# Patient Record
Sex: Male | Born: 1975
Health system: Southern US, Community
[De-identification: ages and names within clinical notes are randomized; demographics above are authoritative.]

## PROBLEM LIST (undated history)

## (undated) DIAGNOSIS — I209 Angina pectoris, unspecified: Secondary | ICD-10-CM

## (undated) DIAGNOSIS — T8859XA Other complications of anesthesia, initial encounter: Secondary | ICD-10-CM

## (undated) DIAGNOSIS — E785 Hyperlipidemia, unspecified: Secondary | ICD-10-CM

## (undated) DIAGNOSIS — J189 Pneumonia, unspecified organism: Secondary | ICD-10-CM

## (undated) DIAGNOSIS — D1721 Benign lipomatous neoplasm of skin and subcutaneous tissue of right arm: Secondary | ICD-10-CM

## (undated) DIAGNOSIS — L0291 Cutaneous abscess, unspecified: Principal | ICD-10-CM

## (undated) HISTORY — DX: Cutaneous abscess, unspecified: L02.91

## (undated) HISTORY — PX: SPINE SURGERY: SHX786

## (undated) HISTORY — PX: WISDOM TOOTH EXTRACTION: SHX21

## (undated) HISTORY — PX: EYE SURGERY: SHX253

---

## 1998-05-16 HISTORY — PX: REFRACTIVE SURGERY: SHX103

## 1998-05-16 HISTORY — PX: EYE SURGERY: SHX253

## 1999-05-17 HISTORY — PX: OTHER SURGICAL HISTORY: SHX169

## 2002-05-16 HISTORY — PX: KNEE CARTILAGE SURGERY: SHX688

## 2012-05-16 HISTORY — PX: OTHER SURGICAL HISTORY: SHX169

## 2013-12-21 ENCOUNTER — Inpatient Hospital Stay
Admit: 2013-12-21 | Discharge: 2013-12-22 | Disposition: A | Discharge status: Home / Self Care | Attending: Emergency Medicine

## 2013-12-21 NOTE — ED Provider Notes (Signed)
PATIENT:          Shawn Brock, Shawn Brock  DOS:           12/21/2013  MR #:             [id number]             ACCOUNT #:     [id number]  DATE OF BIRTH:    August 14, 1975              AGE:           38      PROBLEM LIST:     Abcess on back: Entered Date: 21-Dec-2013 18:52, Entered By:  Estill Dooms,  INTERFACES    HISTORY OF PRESENT ILLNESS:    PERTINENT HISTORY OF PRESENT  ILLNESS. Patient presents today with  an  abscess.  Patient state that he has an abscess on his back for the  past 8  years.  He states that this has gradually gotten larger and while at  work  yesterday felt a "pop" in his abscess and felt purulent drainage.  Patient  denies any systemic symptoms such as fever or chills.    PERTINENT PAST/ FAMILY/SOCIAL HISTORY Patient denies      PHYSICAL EXAM Vital signs stable, afebrile.  Well-developed male in  no  distress.  Focused physical exam reveals a 5 cm fluctuant abscess in  his upper  mid back.  There is no surrounding erythema.  It does appear to be  actively  draining.    MEDICAL DECISION MAKING:    SIGNIFICANT FINDINGS/ED COURSE/MEDICAL DECISION MAKING/TREATMENT  PLAN Consent  was obtained to perform an incision and drainage.  The area was  prepped in  sterile fashion.  7 mL of lidocaine with epinephrine was used to  obtain  anesthesia.  An 11 blade was used to make a incision linearly across  the entire  abscess.  A large amount of purulence was expressed.  Hemostats were  used to  break up loculations.  The abscess was thoroughly irrigated with  normal saline.   The patient tolerated the procedure well.  He was advised to soak in  a bathtub  3 times a day for the next couple days.  Return precautions were  discussed.  He  voiced understanding.  All questions were answered.  He is  discharged.      DIAGNOSIS 5 cm back abscess status post incision and drainage by  Emergency  Department physician    COPIES SENT TO::     Genelle Gather WILLIAM(PCP): [id number]    Electronic Signatures:  Burnis Medin (MD)   (Signed 22-Dec-2013 04:18)   Authored: PROBLEM LIST, HISTORY OF PRESENT ILLNESS, PHYSICAL EXAM,  MEDICAL  DECISION MAKING, DIAGNOSIS, Copies to be sent to:      Last Updated: 22-Dec-2013 04:18 by Burnis Medin (MD)            Please see T-Sheet, initial assessment, and physician orders for  further details.    Dictating Physician: Burnis Medin, MD  Original Electronic Signature Date: 12/21/2013 07:49 P  VN  Document #: [id number]    cc:  Darel Hong, DO       [place] [address]

## 2014-05-15 ENCOUNTER — Emergency Department (HOSPITAL_COMMUNITY): Payer: Federal, State, Local not specified - PPO

## 2014-05-15 ENCOUNTER — Emergency Department (HOSPITAL_COMMUNITY)
Admission: EM | Admit: 2014-05-15 | Discharge: 2014-05-15 | Disposition: A | Discharge status: Home / Self Care | Payer: Federal, State, Local not specified - PPO | Attending: Emergency Medicine | Admitting: Emergency Medicine

## 2014-05-15 ENCOUNTER — Encounter (HOSPITAL_COMMUNITY): Payer: Self-pay | Admitting: *Deleted

## 2014-05-15 DIAGNOSIS — S8261XA Displaced fracture of lateral malleolus of right fibula, initial encounter for closed fracture: Secondary | ICD-10-CM | POA: Insufficient documentation

## 2014-05-15 DIAGNOSIS — S99911A Unspecified injury of right ankle, initial encounter: Secondary | ICD-10-CM | POA: Diagnosis present

## 2014-05-15 DIAGNOSIS — Y998 Other external cause status: Secondary | ICD-10-CM | POA: Diagnosis not present

## 2014-05-15 DIAGNOSIS — Y9389 Activity, other specified: Secondary | ICD-10-CM | POA: Insufficient documentation

## 2014-05-15 DIAGNOSIS — Z7982 Long term (current) use of aspirin: Secondary | ICD-10-CM | POA: Diagnosis not present

## 2014-05-15 DIAGNOSIS — S82401A Unspecified fracture of shaft of right fibula, initial encounter for closed fracture: Secondary | ICD-10-CM

## 2014-05-15 DIAGNOSIS — Y9289 Other specified places as the place of occurrence of the external cause: Secondary | ICD-10-CM | POA: Diagnosis not present

## 2014-05-15 DIAGNOSIS — X58XXXA Exposure to other specified factors, initial encounter: Secondary | ICD-10-CM | POA: Insufficient documentation

## 2014-05-15 DIAGNOSIS — T1490XA Injury, unspecified, initial encounter: Secondary | ICD-10-CM

## 2014-05-15 MED ORDER — IBUPROFEN 800 MG PO TABS: 800.0000 mg | ORAL_TABLET | Freq: Three times a day (TID) | ORAL | Status: DC

## 2014-05-15 NOTE — ED Notes (Signed)
Pt states he rolled his ankle while coming down the steps 2 days ago. Pt states he felt a tearing sensation in his right ankle. Pt's ankle appears swollen. Pt states his pain is 6/10.

## 2014-05-15 NOTE — Discharge Instructions (Signed)
Cryotherapy °Cryotherapy means treatment with cold. Ice or gel packs can be used to reduce both pain and swelling. Ice is the most helpful within the first 24 to 48 hours after an injury or flare-up from overusing a muscle or joint. Sprains, strains, spasms, burning pain, shooting pain, and aches can all be eased with ice. Ice can also be used when recovering from surgery. Ice is effective, has very few side effects, and is safe for most people to use. °PRECAUTIONS  °Ice is not a safe treatment option for people with: °· Raynaud phenomenon. This is a condition affecting small blood vessels in the extremities. Exposure to cold may cause your problems to return. °· Cold hypersensitivity. There are many forms of cold hypersensitivity, including: °¨ Cold urticaria. Red, itchy hives appear on the skin when the tissues begin to warm after being iced. °¨ Cold erythema. This is a red, itchy rash caused by exposure to cold. °¨ Cold hemoglobinuria. Red blood cells break down when the tissues begin to warm after being iced. The hemoglobin that carry oxygen are passed into the urine because they cannot combine with blood proteins fast enough. °· Numbness or altered sensitivity in the area being iced. °If you have any of the following conditions, do not use ice until you have discussed cryotherapy with your caregiver: °· Heart conditions, such as arrhythmia, angina, or chronic heart disease. °· High blood pressure. °· Healing wounds or open skin in the area being iced. °· Current infections. °· Rheumatoid arthritis. °· Poor circulation. °· Diabetes. °Ice slows the blood flow in the region it is applied. This is beneficial when trying to stop inflamed tissues from spreading irritating chemicals to surrounding tissues. However, if you expose your skin to cold temperatures for too long or without the proper protection, you can damage your skin or nerves. Watch for signs of skin damage due to cold. °HOME CARE INSTRUCTIONS °Follow  these tips to use ice and cold packs safely. °· Place a dry or damp towel between the ice and skin. A damp towel will cool the skin more quickly, so you may need to shorten the time that the ice is used. °· For a more rapid response, add gentle compression to the ice. °· Ice for no more than 10 to 20 minutes at a time. The bonier the area you are icing, the less time it will take to get the benefits of ice. °· Check your skin after 5 minutes to make sure there are no signs of a poor response to cold or skin damage. °· Rest 20 minutes or more between uses. °· Once your skin is numb, you can end your treatment. You can test numbness by very lightly touching your skin. The touch should be so light that you do not see the skin dimple from the pressure of your fingertip. When using ice, most people will feel these normal sensations in this order: cold, burning, aching, and numbness. °· Do not use ice on someone who cannot communicate their responses to pain, such as small children or people with dementia. °HOW TO MAKE AN ICE PACK °Ice packs are the most common way to use ice therapy. Other methods include ice massage, ice baths, and cryosprays. Muscle creams that cause a cold, tingly feeling do not offer the same benefits that ice offers and should not be used as a substitute unless recommended by your caregiver. °To make an ice pack, do one of the following: °· Place crushed ice or a   bag of frozen vegetables in a sealable plastic bag. Squeeze out the excess air. Place this bag inside another plastic bag. Slide the bag into a pillowcase or place a damp towel between your skin and the bag.  Mix 3 parts water with 1 part rubbing alcohol. Freeze the mixture in a sealable plastic bag. When you remove the mixture from the freezer, it will be slushy. Squeeze out the excess air. Place this bag inside another plastic bag. Slide the bag into a pillowcase or place a damp towel between your skin and the bag. SEEK MEDICAL CARE  IF:  You develop white spots on your skin. This may give the skin a blotchy (mottled) appearance.  Your skin turns blue or pale.  Your skin becomes waxy or hard.  Your swelling gets worse. MAKE SURE YOU:   Understand these instructions.  Will watch your condition.  Will get help right away if you are not doing well or get worse. Document Released: 12/27/2010 Document Revised: 09/16/2013 Document Reviewed: 12/27/2010 [facility] Patient Information 2015 [place], [place]. This information is not intended to replace advice given to you by your health care provider. Make sure you discuss any questions you have with your health care provider. Fibular Fracture, Ankle, Adult, Treated With or Without Immobilization A fibular fracture at your ankle is a break (fracture) bone in the smallest of the two bones in your lower leg, located on the outside of your leg (fibula) close to the area at your ankle joint. CAUSES  Rolling your ankle.  Twisting your ankle.  Extreme flexing or extending of your foot.  Severe force on your ankle as when falling from a distance. RISK FACTORS  Jumping activities.  Participation in sports.  Osteoporosis.  Advanced age.  Previous ankle injuries. SIGNS AND SYMPTOMS  Pain.  Swelling.  Inability to put weight on injured ankle.  Bruising.  Bone deformities at site of injury. DIAGNOSIS  This fracture is diagnosed with the help of an X-ray exam. TREATMENT  If the fractured bone did not move out of place it usually will heal without problems and does casting or splinting. If immobilization is needed for comfort or the fractured bone moved out of place and will not heal properly with immobilization, a cast or splint will be used. HOME CARE INSTRUCTIONS   Apply ice to the area of injury:  Put ice in a plastic bag.  Place a towel between your skin and the bag.  Leave the ice on for 20 minutes, 2-3 times a day.  Use crutches as directed. Resume  walking without crutches as directed by your health care provider.  Only take over-the-counter or prescription medicines for pain, discomfort, or fever as directed by your health care provider.  If you have a removable splint or boot, do not remove the boot unless directed by your health care provider. SEEK MEDICAL CARE IF:   You have continued pain or more swelling  The medications do not control the pain. SEEK IMMEDIATE MEDICAL CARE IF:  You develop severe pain in the leg or foot.  Your skin or nails below the injury turn blue or grey or feel cold or numb. MAKE SURE YOU:   Understand these instructions.  Will watch your condition.  Will get help right away if you are not doing well or get worse. Document Released: 05/02/2005 Document Revised: 02/20/2013 Document Reviewed: 12/12/2012 [facility] Patient Information 2015 [place], [place]. This information is not intended to replace advice given to you by your health  care provider. Make sure you discuss any questions you have with your health care provider.

## 2014-05-15 NOTE — ED Provider Notes (Signed)
CSN: [id number]     Arrival date & time 05/15/14  2212 History  This chart was scribed for non-physician practitioner, Charlann Lange, PA-C,working with Mirna Mires, MD, by Marlowe Kays, ED Scribe. This patient was seen in room WTR5/WTR5 and the patient's care was started at 10:32 PM.  Chief Complaint  Patient presents with  . Ankle Injury   The history is provided by the patient. No language interpreter was used.    HPI Comments:  Brandon Clayton is a 38 y.o. male who presents to the Emergency Department complaining of a right ankle injury that occurred yesterday. Pt states he rolled the ankle and felt a tearing sensation. He reports moderate lateral pain and swelling. He states he has been ambulatory since the incident. He has been taking Ibuprofen with no significant relief of the pain and swelling. Walking tends to make the pain worse. Denies alleviating factors. Denies numbness, tingling, nausea or vomiting.   History reviewed. No pertinent past medical history. History reviewed. No pertinent past surgical history. No family history on file. History  Substance Use Topics  . Smoking status: Never Smoker   . Smokeless tobacco: Not on file  . Alcohol Use: No    Review of Systems  Gastrointestinal: Negative for nausea and vomiting.  Musculoskeletal: Positive for joint swelling and arthralgias.  Skin: Negative for wound.  Neurological: Negative for numbness.  All other systems reviewed and are negative.   Allergies  Review of patient's allergies indicates no known allergies.  Home Medications   Prior to Admission medications   Medication Sig Start Date End Date Taking? Authorizing Provider  acetaminophen (TYLENOL) 500 MG tablet Take 1,000 mg by mouth every 6 (six) hours as needed for mild pain.   Yes Historical Provider, MD  aspirin 325 MG tablet Take 325 mg by mouth every 4 (four) hours as needed for mild pain.   Yes Historical Provider, MD   Triage Vitals: BP 106/63  mmHg  Pulse 78  Temp(Src) 97.4 F (36.3 C) (Oral)  SpO2 97% Physical Exam  Constitutional: He is oriented to person, place, and time. He appears well-developed and well-nourished.  HENT:  Head: Normocephalic and atraumatic.  Eyes: EOM are normal.  Neck: Normal range of motion.  Cardiovascular: Normal rate.   Pulmonary/Chest: Effort normal.  Musculoskeletal: Normal range of motion. He exhibits tenderness.  Right ankle markedly swollen, greater laterally than medially. Moderate ecchymosis. Stable joint. Swelling extends to lateral forefoot without tenderness. No calf or shin tenderness.  Neurological: He is alert and oriented to person, place, and time.  Distal sensations intact.  Skin: Skin is warm and dry.  Psychiatric: He has a normal mood and affect. His behavior is normal.  Nursing note and vitals reviewed.   ED Course  Procedures (including critical care time) DIAGNOSTIC STUDIES: Oxygen Saturation is 97% on RA, normal by my interpretation.   COORDINATION OF CARE: 10:36 PM- Will X-Ray right foot and ankle. Offered pain medication but pt declined. Pt verbalizes understanding and agrees to plan.  Medications - No data to display  Labs Review Labs Reviewed - No data to display  Imaging Review Dg Ankle Complete Right  05/15/2014   CLINICAL DATA:  [place] down garage steps 2 days ago, lateral ankle pain and swelling.  EXAM: RIGHT ANKLE - COMPLETE 3+ VIEW; RIGHT FOOT COMPLETE - 3+ VIEW  COMPARISON:  None.  FINDINGS: Lateral malleolus comminuted avulsion fracture. Fractures in alignment. Ankle mortise is congruent, tibial fibular syndesmosis appears intact. No destructive bony  lesions. Lateral ankle soft tissue swelling without subcutaneous gas or radiopaque foreign bodies.  IMPRESSION: Comminuted lateral malleolus acute avulsion fracture without dislocation.   Electronically Signed   By: Elon Alas   On: 05/15/2014 23:32   Dg Foot Complete Right  05/15/2014   CLINICAL DATA:   [place] down garage steps 2 days ago, lateral ankle pain and swelling.  EXAM: RIGHT ANKLE - COMPLETE 3+ VIEW; RIGHT FOOT COMPLETE - 3+ VIEW  COMPARISON:  None.  FINDINGS: Lateral malleolus comminuted avulsion fracture. Fractures in alignment. Ankle mortise is congruent, tibial fibular syndesmosis appears intact. No destructive bony lesions. Lateral ankle soft tissue swelling without subcutaneous gas or radiopaque foreign bodies.  IMPRESSION: Comminuted lateral malleolus acute avulsion fracture without dislocation.   Electronically Signed   By: Elon Alas   On: 05/15/2014 23:32     EKG Interpretation None      MDM   Final diagnoses:  Injury    1. Right fibula fracture  CAM walker boot provided. Patient has crutches. Discussed weight bearing ok as tolerated, orthopedic follow up to insure appropriate healing. Patient declines pain medication.   I personally performed the services described in this documentation, which was scribed in my presence. The recorded information has been reviewed and is accurate.    Dewaine Oats, PA-C 05/15/14 [place], MD 05/15/14 [phone]

## 2015-12-03 ENCOUNTER — Ambulatory Visit (HOSPITAL_COMMUNITY)
Admission: RE | Admit: 2015-12-03 | Discharge: 2015-12-03 | Disposition: A | Discharge status: Home / Self Care | Payer: Federal, State, Local not specified - PPO | Source: Ambulatory Visit | Attending: Physician Assistant | Admitting: Physician Assistant

## 2015-12-03 ENCOUNTER — Ambulatory Visit (INDEPENDENT_AMBULATORY_CARE_PROVIDER_SITE_OTHER): Payer: Federal, State, Local not specified - PPO | Admitting: Physician Assistant

## 2015-12-03 VITALS — BP 122/70 | HR 84 | Temp 98.5°F | Resp 18 | Ht 68.0 in | Wt 172.0 lb

## 2015-12-03 DIAGNOSIS — S060X0A Concussion without loss of consciousness, initial encounter: Secondary | ICD-10-CM

## 2015-12-03 DIAGNOSIS — R112 Nausea with vomiting, unspecified: Secondary | ICD-10-CM | POA: Diagnosis present
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
## 2015-12-03 NOTE — Patient Instructions (Addendum)
Please report to [facility] and go to the first floor registration for your CT scan. They are expecting you now.     IF you received an x-ray today, you will receive an invoice from [facility]. Please contact [facility] at [phone] with questions or concerns regarding your invoice.   IF you received labwork today, you will receive an invoice from [facility]. Please contact [facility] at [phone] with questions or concerns regarding your invoice.   Our billing staff will not be able to assist you with questions regarding bills from these companies.  You will be contacted with the lab results as soon as they are available. The fastest way to get your results is to activate your My Chart account. Instructions are located on the last page of this paperwork. If you have not heard from [place] regarding the results in 2 weeks, please contact this office.

## 2016-05-11 ENCOUNTER — Ambulatory Visit (INDEPENDENT_AMBULATORY_CARE_PROVIDER_SITE_OTHER): Payer: Federal, State, Local not specified - PPO | Admitting: Family Medicine

## 2016-05-11 VITALS — BP 122/76 | HR 65 | Temp 97.7°F | Resp 17 | Wt 183.0 lb

## 2016-05-11 DIAGNOSIS — Z0189 Encounter for other specified special examinations: Secondary | ICD-10-CM | POA: Diagnosis not present

## 2016-05-11 NOTE — Patient Instructions (Signed)
     IF you received an x-ray today, you will receive an invoice from [facility]. Please contact [facility] at [phone] with questions or concerns regarding your invoice.   IF you received labwork today, you will receive an invoice from [facility]. Please contact [facility] at [phone] with questions or concerns regarding your invoice.   Our billing staff will not be able to assist you with questions regarding bills from these companies.  You will be contacted with the lab results as soon as they are available. The fastest way to get your results is to activate your My Chart account. Instructions are located on the last page of this paperwork. If you have not heard from us regarding the results in 2 weeks, please contact this office.     

## 2016-05-11 NOTE — Progress Notes (Signed)
Lab Visit Only   Patient brought with him a written order from Dr.Peter R. Martha Clan, MD with [facility].  The order request the following labs: Hemachromatosis Genetic Assay, Ferritin, Iron, TIBC.  He requests results to be faxed to [phone].  Carroll Sage. Kenton Kingfisher, MSN, FNP-C [facility]

## 2016-05-18 LAB — IRON AND TIBC
Iron Saturation: 46 % (ref 15–55)
Iron: 115 ug/dL (ref 38–169)
Total Iron Binding Capacity: 249 ug/dL — ABNORMAL LOW (ref 250–450)
UIBC: 134 ug/dL (ref 111–343)

## 2016-05-18 LAB — FERRITIN: FERRITIN: 336 ng/mL (ref 30–400)

## 2016-05-18 LAB — HEMOCHROMATOSIS DNA-PCR(C282Y,H63D)

## 2016-05-26 DIAGNOSIS — Z6826 Body mass index (BMI) 26.0-26.9, adult: Secondary | ICD-10-CM | POA: Diagnosis not present

## 2016-05-26 DIAGNOSIS — J0100 Acute maxillary sinusitis, unspecified: Secondary | ICD-10-CM | POA: Diagnosis not present

## 2016-07-26 ENCOUNTER — Encounter: Payer: Self-pay | Admitting: Emergency Medicine

## 2016-07-26 ENCOUNTER — Emergency Department
Admission: EM | Admit: 2016-07-26 | Discharge: 2016-07-26 | Disposition: A | Discharge status: Home / Self Care | Payer: Federal, State, Local not specified - PPO | Attending: Emergency Medicine | Admitting: Emergency Medicine

## 2016-07-26 ENCOUNTER — Emergency Department: Payer: Federal, State, Local not specified - PPO

## 2016-07-26 DIAGNOSIS — R600 Localized edema: Secondary | ICD-10-CM | POA: Diagnosis not present

## 2016-07-26 DIAGNOSIS — M7989 Other specified soft tissue disorders: Secondary | ICD-10-CM | POA: Diagnosis not present

## 2016-07-26 DIAGNOSIS — L03113 Cellulitis of right upper limb: Secondary | ICD-10-CM

## 2016-07-26 LAB — BASIC METABOLIC PANEL
Anion gap: 5 (ref 5–15)
BUN: 12 mg/dL (ref 6–20)
CO2: 25 mmol/L (ref 22–32)
Calcium: 9.1 mg/dL (ref 8.9–10.3)
Chloride: 106 mmol/L (ref 101–111)
Creatinine, Ser: 0.9 mg/dL (ref 0.61–1.24)
GFR calc Af Amer: >60 mL/min (ref >60)
GFR calc non Af Amer: >60 mL/min (ref >60)
Glucose, Bld: 106 mg/dL — ABNORMAL HIGH (ref 65–99)
Potassium: 3.8 mmol/L (ref 3.5–5.1)
Sodium: 136 mmol/L (ref 135–145)

## 2016-07-26 LAB — CBC WITH DIFFERENTIAL/PLATELET
Basophils Absolute: 0 10*3/uL (ref 0–0.1)
Basophils Relative: 0 %
Eosinophils Absolute: 0.1 10*3/uL (ref 0–0.7)
Eosinophils Relative: 1 %
HCT: 43 % (ref 40.0–52.0)
Hemoglobin: 14.8 g/dL (ref 13.0–18.0)
Lymphocytes Relative: 12 %
Lymphs Abs: 1.7 10*3/uL (ref 1.0–3.6)
MCH: 31.8 pg (ref 26.0–34.0)
MCHC: 34.5 g/dL (ref 32.0–36.0)
MCV: 92.2 fL (ref 80.0–100.0)
Monocytes Absolute: 1.1 10*3/uL — ABNORMAL HIGH (ref 0.2–1.0)
Monocytes Relative: 8 %
Neutro Abs: 10.7 10*3/uL — ABNORMAL HIGH (ref 1.4–6.5)
Neutrophils Relative %: 79 %
Platelets: 277 10*3/uL (ref 150–440)
RBC: 4.67 MIL/uL (ref 4.40–5.90)
RDW: 12.8 % (ref 11.5–14.5)
WBC: 13.6 10*3/uL — ABNORMAL HIGH (ref 3.8–10.6)

## 2016-07-26 MED ORDER — CLINDAMYCIN HCL 150 MG PO CAPS
300.0000 mg | ORAL_CAPSULE | Freq: Once | ORAL | Status: AC
  Administered 2016-07-26: 300 mg via ORAL
  Filled 2016-07-26: qty 2

## 2016-07-26 MED ORDER — CLINDAMYCIN HCL 300 MG PO CAPS: 300.0000 mg | ORAL_CAPSULE | Freq: Three times a day (TID) | ORAL | 0 refills | Status: DC

## 2016-07-26 NOTE — ED Triage Notes (Signed)
Pt c/o right shoulder pain and right arm pain. Pain worse with movement especially extension of right hand. 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 Pain x 1 week. Pain relieved when arm over head.  Reports pain worse if presses on a nerve in his arm. Ambulatory to triage without difficulty.

## 2016-07-26 NOTE — ED Provider Notes (Signed)
[facility] Emergency Department Provider Note  ____________________________________________  Time seen: Approximately 7:51 PM  I have reviewed the triage vital signs and the nursing notes.   HISTORY  Chief Complaint Shoulder Pain    HPI Brandon Clayton is a 41 y.o. male presenting to the emergency department with 10 out of 10 right forearm pain, erythema and edema. Patient states that right forearm started feeling "hot" yesterday. He denies fever or chills. He denies streaking of the skin overlying the right upper extremity. Patient states that right upper extremity pain feels throbbing and is relieved with positioning the right upper extremity above the head. Patient denies falls or mechanism of trauma. Patient states that his pain has been constant throughout the day. Patient takes no medications daily. Patient denies prolonged immobilization, recent surgery, history of obesity, or daily smoking. No alleviating measures and attempted.   History reviewed. No pertinent past medical history.  There are no active problems to display for this patient.   History reviewed. No pertinent surgical history.  Prior to Admission medications   Not on File    Allergies Patient has no known allergies.  Family History  Problem Relation Age of Onset  . Diabetes Mother   . Cancer Mother   . Heart disease Mother   . Hyperlipidemia Mother   . Hypertension Mother   . Hyperlipidemia Father     Social History Social History  Substance Use Topics  . Smoking status: Never Smoker  . Smokeless tobacco: Never Used  . Alcohol use No     Review of Systems  Constitutional: No fever/chills Eyes: No visual changes. No discharge ENT: No upper respiratory complaints. Cardiovascular: no chest pain. Respiratory: no cough. No SOB. Gastrointestinal: No abdominal pain.  No nausea, no vomiting.  No diarrhea.  No constipation. Musculoskeletal: Patient has right  upper extremity pain. Skin: Patient has erythema and edema of the skin overlying the right forearm. Neurological: Negative for headaches, focal weakness or numbness. ____________________________________________   PHYSICAL EXAM:  VITAL SIGNS: ED Triage Vitals  Enc Vitals Group     BP 07/26/16 1828 114/68     Pulse Rate 07/26/16 1828 (!) 105     Resp 07/26/16 1828 18     Temp 07/26/16 1827 99.6 F (37.6 C)     Temp Source 07/26/16 1827 Oral     SpO2 07/26/16 1828 98 %     Weight 07/26/16 1827 180 lb (81.6 kg)     Height 07/26/16 1827 5\' 9"  (1.753 m)     Head Circumference --      Peak Flow --      Pain Score 07/26/16 1827 10     Pain Loc --      Pain Edu? --      Excl. in [place]? --      Constitutional: Alert and oriented. Patient is talkative and engaged.  Eyes: Palpebral and bulbar conjunctiva are nonerythematous bilaterally. PERRL. EOMI. No scleral icterus bilaterally. Head: Atraumatic. ENT: Hematological/Lymphatic/Immunilogical: No cervical lymphadenopathy.  Cardiovascular: Normal rate, regular rhythm. Normal S1 and S2. No murmurs, gallops or rubs auscultated.  Respiratory: Resonant and symmetric percussion tones bilaterally. On auscultation, adventitious sounds are absent.  Musculoskeletal: Patient has 5/5 strength in the upper and lower extremities bilaterally. Full range of motion at the shoulder, elbow and wrist bilaterally. Full range of motion at the hip, knee and ankle bilaterally. No changes in gait. Palpable radial and ulnar pulses bilaterally and symmetrically.  Neurologic:  Normal for  age. No gross focal neurologic deficits are appreciated. Reflexes are 2+ and symmetric in the upper extremity bilaterally. Skin: Patient has blanching erythema of the skin overlying the volar aspect of the right forearm. Skin overlying right forearm is excessively warm to palpation. No palpable fluctuance or induration. Psychiatric: Mood and affect are normal for age. Speech and behavior  are normal.  ____________________________________________   LABS (all labs ordered are listed, but only abnormal results are displayed)  Labs Reviewed  CBC WITH DIFFERENTIAL/PLATELET   ____________________________________________  EKG   ____________________________________________  RADIOLOGY   No results found.  ____________________________________________    PROCEDURES  Procedure(s) performed:    Procedures    Medications - No data to display   ____________________________________________   INITIAL IMPRESSION / ASSESSMENT AND PLAN / ED COURSE  Pertinent labs & imaging results that were available during my care of the patient were reviewed by me and considered in my medical decision making (see chart for details).  Review of the [place] CSRS was performed in accordance of the [place] prior to dispensing any controlled drugs.     Assessment and plan: Cellulitis: Patient presents to the emergency department with erythema, edema and right upper extremity pain. Venous ultrasound of the right upper extremity was noncontributory for thromboembolism. Venous ultrasound of the right upper extremity revealed a complex fluid accumulation suspicious for abscess formation. Incision and drainage cannot be performed at this time based on physical exam findings. Patient was started on clindamycin in the emergency department. Patient was discharged with clindamycin. Parameters of cellulitis were demarcated. Strict return precautions were given to patient twice. Vital signs are reassuring at this time aside from mild tachycardia. All patient questions were answered.   ____________________________________________  FINAL CLINICAL IMPRESSION(S) / ED DIAGNOSES  Final diagnoses:  Arm swelling      NEW MEDICATIONS STARTED DURING THIS VISIT:  New Prescriptions   No medications on file        This chart was dictated using voice recognition software/Dragon. Despite best efforts  to proofread, errors can occur which can change the meaning. Any change was purely unintentional.    Lannie Fields, PA-C 07/26/16 2159    Orbie Pyo, MD 07/26/16 [phone]

## 2016-07-26 NOTE — ED Notes (Signed)

## 2016-07-26 NOTE — ED Notes (Signed)
See triage note  States he developed pain to right shoulder which moves into neck about 1 week ago  W/o injury  Now states pain is moving into forearm   Increased pain with movement of fingers  And palpation

## 2016-07-28 ENCOUNTER — Observation Stay: Payer: Federal, State, Local not specified - PPO | Admitting: Anesthesiology

## 2016-07-28 ENCOUNTER — Observation Stay
Admission: EM | Admit: 2016-07-28 | Discharge: 2016-07-29 | Disposition: A | Discharge status: Home / Self Care | Payer: Federal, State, Local not specified - PPO | Attending: General Surgery | Admitting: General Surgery

## 2016-07-28 ENCOUNTER — Encounter
Admission: EM | Disposition: A | Discharge status: Home / Self Care | Payer: Self-pay | Source: Home / Self Care | Attending: Emergency Medicine

## 2016-07-28 ENCOUNTER — Encounter: Payer: Self-pay | Admitting: Emergency Medicine

## 2016-07-28 DIAGNOSIS — S50812A Abrasion of left forearm, initial encounter: Secondary | ICD-10-CM | POA: Insufficient documentation

## 2016-07-28 DIAGNOSIS — Y939 Activity, unspecified: Secondary | ICD-10-CM | POA: Diagnosis not present

## 2016-07-28 DIAGNOSIS — Z79899 Other long term (current) drug therapy: Secondary | ICD-10-CM | POA: Diagnosis not present

## 2016-07-28 DIAGNOSIS — W5503XA Scratched by cat, initial encounter: Secondary | ICD-10-CM | POA: Insufficient documentation

## 2016-07-28 DIAGNOSIS — L0291 Cutaneous abscess, unspecified: Secondary | ICD-10-CM

## 2016-07-28 DIAGNOSIS — Z8249 Family history of ischemic heart disease and other diseases of the circulatory system: Secondary | ICD-10-CM | POA: Diagnosis not present

## 2016-07-28 DIAGNOSIS — Z809 Family history of malignant neoplasm, unspecified: Secondary | ICD-10-CM | POA: Diagnosis not present

## 2016-07-28 DIAGNOSIS — L02413 Cutaneous abscess of right upper limb: Principal | ICD-10-CM | POA: Diagnosis present

## 2016-07-28 DIAGNOSIS — F909 Attention-deficit hyperactivity disorder, unspecified type: Secondary | ICD-10-CM | POA: Insufficient documentation

## 2016-07-28 DIAGNOSIS — B9562 Methicillin resistant Staphylococcus aureus infection as the cause of diseases classified elsewhere: Secondary | ICD-10-CM | POA: Diagnosis not present

## 2016-07-28 DIAGNOSIS — Z833 Family history of diabetes mellitus: Secondary | ICD-10-CM | POA: Insufficient documentation

## 2016-07-28 DIAGNOSIS — S50811A Abrasion of right forearm, initial encounter: Secondary | ICD-10-CM | POA: Insufficient documentation

## 2016-07-28 HISTORY — PX: INCISION AND DRAINAGE ABSCESS: SHX5864

## 2016-07-28 LAB — CBC WITH DIFFERENTIAL/PLATELET
BASOS ABS: 0 10*3/uL (ref 0–0.1)
Basophils Relative: 0 %
EOS PCT: 2 %
Eosinophils Absolute: 0.2 10*3/uL (ref 0–0.7)
HCT: 45.5 % (ref 40.0–52.0)
Hemoglobin: 15.7 g/dL (ref 13.0–18.0)
LYMPHS PCT: 22 %
Lymphs Abs: 2.3 10*3/uL (ref 1.0–3.6)
MCH: 31.9 pg (ref 26.0–34.0)
MCHC: 34.6 g/dL (ref 32.0–36.0)
MCV: 92.2 fL (ref 80.0–100.0)
MONOS PCT: 9 %
Monocytes Absolute: 0.9 10*3/uL (ref 0.2–1.0)
Neutro Abs: 6.8 10*3/uL — ABNORMAL HIGH (ref 1.4–6.5)
Neutrophils Relative %: 67 %
PLATELETS: 321 10*3/uL (ref 150–440)
RBC: 4.93 MIL/uL (ref 4.40–5.90)
RDW: 12.9 % (ref 11.5–14.5)
WBC: 10.2 10*3/uL (ref 3.8–10.6)

## 2016-07-28 LAB — COMPREHENSIVE METABOLIC PANEL
ALBUMIN: 4.1 g/dL (ref 3.5–5.0)
ALT: 78 U/L — ABNORMAL HIGH (ref 17–63)
AST: 72 U/L — AB (ref 15–41)
Alkaline Phosphatase: 101 U/L (ref 38–126)
Anion gap: 7 (ref 5–15)
BILIRUBIN TOTAL: 0.8 mg/dL (ref 0.3–1.2)
BUN: 13 mg/dL (ref 6–20)
CO2: 28 mmol/L (ref 22–32)
Calcium: 9.6 mg/dL (ref 8.9–10.3)
Chloride: 104 mmol/L (ref 101–111)
Creatinine, Ser: 0.96 mg/dL (ref 0.61–1.24)
GFR calc Af Amer: >60 mL/min (ref >60)
GFR calc non Af Amer: >60 mL/min (ref >60)
Glucose, Bld: 104 mg/dL — ABNORMAL HIGH (ref 65–99)
POTASSIUM: 4 mmol/L (ref 3.5–5.1)
Sodium: 139 mmol/L (ref 135–145)
TOTAL PROTEIN: 7.8 g/dL (ref 6.5–8.1)

## 2016-07-28 LAB — LACTIC ACID, PLASMA: Lactic Acid, Venous: 1.2 mmol/L (ref 0.5–1.9)

## 2016-07-28 SURGERY — INCISION AND DRAINAGE, ABSCESS
Anesthesia: General | Site: Arm Lower | Laterality: Right | Wound class: Dirty or Infected

## 2016-07-28 MED ORDER — MORPHINE SULFATE (PF) 2 MG/ML IV SOLN
2.0000 mg | INTRAVENOUS | Status: DC | PRN
  Administered 2016-07-28: 2 mg via INTRAVENOUS
  Filled 2016-07-28 (×2): qty 1

## 2016-07-28 MED ORDER — DIPHENHYDRAMINE HCL 50 MG/ML IJ SOLN: 25.0000 mg | Freq: Four times a day (QID) | INTRAMUSCULAR | Status: DC | PRN

## 2016-07-28 MED ORDER — MIDAZOLAM HCL 2 MG/2ML IJ SOLN
INTRAMUSCULAR | Status: AC
  Filled 2016-07-28: qty 2

## 2016-07-28 MED ORDER — FENTANYL CITRATE (PF) 100 MCG/2ML IJ SOLN
INTRAMUSCULAR | Status: AC
  Filled 2016-07-28: qty 2

## 2016-07-28 MED ORDER — ONDANSETRON HCL 4 MG/2ML IJ SOLN: 4.0000 mg | Freq: Four times a day (QID) | INTRAMUSCULAR | Status: DC | PRN

## 2016-07-28 MED ORDER — ONDANSETRON 4 MG PO TBDP: 4.0000 mg | ORAL_TABLET | Freq: Four times a day (QID) | ORAL | Status: DC | PRN

## 2016-07-28 MED ORDER — DIPHENHYDRAMINE HCL 25 MG PO CAPS: 25.0000 mg | ORAL_CAPSULE | Freq: Four times a day (QID) | ORAL | Status: DC | PRN

## 2016-07-28 MED ORDER — PROPOFOL 10 MG/ML IV BOLUS
INTRAVENOUS | Status: AC
  Filled 2016-07-28: qty 20

## 2016-07-28 MED ORDER — LIDOCAINE HCL (PF) 2 % IJ SOLN
INTRAMUSCULAR | Status: AC
  Filled 2016-07-28: qty 2

## 2016-07-28 SURGICAL SUPPLY — 20 items
CANISTER SUCT 1200ML W/VALVE (MISCELLANEOUS) ×2 IMPLANT
CHLORAPREP W/TINT 26ML (MISCELLANEOUS) ×2 IMPLANT
COVER PROBE FLX POLY STRL (MISCELLANEOUS) ×2 IMPLANT
DRAPE LAPAROTOMY 100X77 ABD (DRAPES) ×2 IMPLANT
ELECT CAUTERY BLADE 6.4 (BLADE) ×2 IMPLANT
ELECT REM PT RETURN 9FT ADLT (ELECTROSURGICAL) ×2
ELECTRODE REM PT RTRN 9FT ADLT (ELECTROSURGICAL) ×1 IMPLANT
GAUZE PACKING 1/4X5YD (GAUZE/BANDAGES/DRESSINGS) ×2 IMPLANT
GLOVE BIO SURGEON STRL SZ7.5 (GLOVE) ×4 IMPLANT
GLOVE INDICATOR 8.0 STRL GRN (GLOVE) ×4 IMPLANT
GOWN STRL REUS W/ TWL LRG LVL3 (GOWN DISPOSABLE) ×2 IMPLANT
GOWN STRL REUS W/TWL LRG LVL3 (GOWN DISPOSABLE) ×2
KIT RM TURNOVER STRD PROC AR (KITS) ×2 IMPLANT
LABEL OR SOLS (LABEL) ×2 IMPLANT
NEEDLE HYPO 25X1 1.5 SAFETY (NEEDLE) ×2 IMPLANT
NS IRRIG 500ML POUR BTL (IV SOLUTION) ×4 IMPLANT
PACK BASIN MINOR ARMC (MISCELLANEOUS) ×2 IMPLANT
PAD ABD DERMACEA PRESS 5X9 (GAUZE/BANDAGES/DRESSINGS) ×4 IMPLANT
SYR BULB EAR ULCER 3OZ GRN STR (SYRINGE) ×2 IMPLANT
SYRINGE 10CC LL (SYRINGE) ×2 IMPLANT

## 2016-07-28 NOTE — H&P (Signed)
Patient ID: Brandon Clayton, male   DOB: 07/26/1975, 41 y.o.   MRN: [id number]  CC: Right Arm Infection  HPI Brandon Clayton is a 41 y.o. male presents to the ER with a right arm infection. Patient reports he was seen in the ER 2 days ago and found to have a right forearm abscess. He was provided with antibiotics and did not undergo an incision and drainage at that time. Since being seen in the ER the area of swelling, pain, cellulitis has increased in size and intensity. He has been unable to work due to increasing pain and comes back to the ER very frustrated that things are Worse. He denies any fevers, chills, nausea, vomiting, chest pain, shortness breath, diarrhea, constipation. His only complaint is of pain to his right forearm. He denies any IV drug use or any recent trauma or change in health. He does however have cats and has scratch marks on bilateral forearms. He is otherwise in his usual state of health.  HPI  History reviewed. No pertinent past medical history. Patient currently on no chronic medications, however he is being treated with clindamycin on outpatient basis.  History reviewed. No pertinent surgical history. Patient has had right hand surgery before but otherwise no operations.  Family History  Problem Relation Age of Onset  . Diabetes Mother   . Cancer Mother   . Heart disease Mother   . Hyperlipidemia Mother   . Hypertension Mother   . Hyperlipidemia Father     Social History Social History  Substance Use Topics  . Smoking status: Never Smoker  . Smokeless tobacco: Never Used  . Alcohol use No    No Known Allergies  No current facility-administered medications for this encounter.    Current Outpatient Prescriptions  Medication Sig Dispense Refill  . clindamycin (CLEOCIN) 300 MG capsule Take 1 capsule (300 mg total) by mouth 3 (three) times daily. 30 capsule 0     Review of Systems A Multi-point review of systems was asked and  was negative except for the findings documented in the history of present illness  Physical Exam Blood pressure (!) 129/91, pulse 97, temperature 98.1 F (36.7 C), temperature source Oral, resp. rate 18, height 5\' 9"  (1.753 m), weight 81.6 kg (180 lb), SpO2 100 %. CONSTITUTIONAL: No acute distress. EYES: Pupils are equal, round, and reactive to light, Sclera are non-icteric. EARS, NOSE, MOUTH AND THROAT: The oropharynx is clear. The oral mucosa is pink and moist. Hearing is intact to voice. LYMPH NODES:  Lymph nodes in the neck are normal. RESPIRATORY:  Lungs are clear. There is normal respiratory effort, with equal breath sounds bilaterally, and without pathologic use of accessory muscles. CARDIOVASCULAR: Heart is regular without murmurs, gallops, or rubs. GI: The abdomen is soft, nontender, and nondistended. There are no palpable masses. There is no hepatosplenomegaly. There are normal bowel sounds in all quadrants. GU: Rectal deferred.   MUSCULOSKELETAL: Normal muscle strength and tone. No cyanosis or edema.   SKIN: Turgor is good. The right forearm is noticeably more swollen than the left. There is mild blanching erythema over the entire palmar aspect of the forearm. There are some scratch marks distal to the area in question. There is a healing wound on the opposite side of the hand. NEUROLOGIC: Motor and sensation is grossly normal. Cranial nerves are grossly intact. PSYCH:  Oriented to person, place and time. Affect is normal.  Data Reviewed Images and labs reviewed. Patient has a normal  white blood cell count 10.2, his only abnormal labs are mild increases in transaminases with an ALT of 78, AST is 72. Everything else is within normal limits. He had a mild leukocytosis 2 days ago with a white count 13.6. He had an ultrasound performed 2 days ago which showed a 1.5 x 1.5 x 2.2 cm complex fluid collection consistent with an abscess. Bedside ultrasound today shows the area appears to have  increased in size I have personally reviewed the patient's imaging, laboratory findings and medical records.    Assessment    Right arm abscess    Plan    41 year old male with an deep abscess of the right arm. Given the lack of any other traumas is mostly related to cat scratches. Provided him with numerous treatment options including attempting to access the abscess in the ER versus going to the operating room. Patient states he would rather go the operating room. Plan removal patient in the hospital under observation, once he is 8 hours from his last meal he'll be taken to the operating room for some sedation and then incision, drainage, culture of the right forearm abscess. The risks, benefits, alternatives of the incision and drainage were discussed and he voiced understanding. He will go to the operating room tonight for the incision and drainage.     Time spent with the patient was 45 minutes, with more than 50% of the time spent in face-to-face education, counseling and care coordination.     Clayburn Pert, MD FACS General Surgeon 07/28/2016, 9:19 PM

## 2016-07-28 NOTE — ED Provider Notes (Signed)
[facility] Emergency Department Provider Note  ____________________________________________   First MD Initiated Contact with Patient 07/28/16 2005     (approximate)  I have reviewed the triage vital signs and the nursing notes.   HISTORY  Chief Complaint Cellulitis    HPI Rueben Kassim III is a 41 y.o. male without any chronic medical conditions was presenting to the emergency department with worsening cellulitis. He was seen in this emergency department on March 13, 2 days ago, for right upper extremity cellulitis. At that time as well he had an ultrasound done of his right upper extremity to rule out a deep vein thrombosis. There was no deep vein thrombosis found but there was a fluid collection suspicious for abscess identified. The patient was discharged home on clindamycin and has had worsening cellulitis and pain despite being compliant with his medications. This past Tuesday as line was drawn with pending on his skin outlining the cellulitis. Since then the cellulitis has grown outside the boundaries of the line and there has also been increased swelling of the right forearm with increased pain.  Denies IVDU.     History reviewed. No pertinent past medical history.  There are no active problems to display for this patient.   History reviewed. No pertinent surgical history.  Prior to Admission medications   Medication Sig Start Date End Date Taking? Authorizing Provider  clindamycin (CLEOCIN) 300 MG capsule Take 1 capsule (300 mg total) by mouth 3 (three) times daily. 07/26/16 08/05/16  Lannie Fields, PA-C    Allergies Patient has no known allergies.  Family History  Problem Relation Age of Onset  . Diabetes Mother   . Cancer Mother   . Heart disease Mother   . Hyperlipidemia Mother   . Hypertension Mother   . Hyperlipidemia Father     Social History Social History  Substance Use Topics  . Smoking status: Never Smoker  .  Smokeless tobacco: Never Used  . Alcohol use No    Review of Systems Constitutional: No fever/chills Eyes: No visual changes. ENT: No sore throat. Cardiovascular: Denies chest pain. Respiratory: Denies shortness of breath. Gastrointestinal: No abdominal pain.  No nausea, no vomiting.   Genitourinary: Negative for dysuria. Musculoskeletal: Negative for back pain. Skin: as above Neurological: Negative for headaches, focal weakness or numbness.  10-point ROS otherwise negative.  ____________________________________________   PHYSICAL EXAM:  VITAL SIGNS: ED Triage Vitals  Enc Vitals Group     BP 07/28/16 1837 (!) 130/96     Pulse Rate 07/28/16 1837 (!) 103     Resp 07/28/16 1837 18     Temp 07/28/16 1837 98.1 F (36.7 C)     Temp Source 07/28/16 1837 Oral     SpO2 07/28/16 1837 97 %     Weight 07/28/16 1837 180 lb (81.6 kg)     Height 07/28/16 1837 5\' 9"  (1.753 m)     Head Circumference --      Peak Flow --      Pain Score 07/28/16 1838 9     Pain Loc --      Pain Edu? --      Excl. in [place]? --     Constitutional: Alert and oriented. Well appearing and in no acute distress. Eyes: Conjunctivae are normal. PERRL. EOMI. Head: Atraumatic. Nose: No congestion/rhinnorhea. Mouth/Throat: Mucous membranes are moist.   Neck: No stridor.   Cardiovascular: Normal rate, regular rhythm. Grossly normal heart sounds.   Respiratory: Normal respiratory effort.  No retractions. Lungs CTAB. Gastrointestinal: Soft and nontender. No distention. No abdominal bruits. No CVA tenderness. Musculoskeletal: No lower extremity tenderness nor edema.  No joint effusions. Neurologic:  Normal speech and language. No gross focal neurologic deficits are appreciated.  Skin:  Right forearm with erythema extending on the volar surface and wrapping around medially but not circumferentially. There is tenderness over the midshaft of the forearm but no fluctuance or pus drainage. No tract marks  visualized. Psychiatric: Mood and affect are normal. Speech and behavior are normal.  ____________________________________________   LABS (all labs ordered are listed, but only abnormal results are displayed)  Labs Reviewed  COMPREHENSIVE METABOLIC PANEL - Abnormal; Notable for the following:       Result Value   Glucose, Bld 104 (*)    AST 72 (*)    ALT 78 (*)    All other components within normal limits  CBC WITH DIFFERENTIAL/PLATELET - Abnormal; Notable for the following:    Neutro Abs 6.8 (*)    All other components within normal limits  LACTIC ACID, PLASMA   ____________________________________________  EKG   ____________________________________________  RADIOLOGY  Bedside ultrasound performed with 3 visualization of the pocket that is about 2 cm deep to the skin. There is tenderness to palpation when pressure is applied directly over this pocket with the ultrasound probe. ____________________________________________   PROCEDURES  Procedure(s) performed:   Procedures  Critical Care performed:   ____________________________________________   INITIAL IMPRESSION / ASSESSMENT AND PLAN / ED COURSE  Pertinent labs & imaging results that were available during my care of the patient were reviewed by me and considered in my medical decision making (see chart for details).  Patient was offered pain medications but had refused.  ----------------------------------------- 8:49 PM on 07/28/2016 -----------------------------------------  The surgeon, Dr. Adonis Huguenin, evaluated the patient the bedside.Because the abscess is not superficial and I'm unable to see any obvious but drained surgery was called. Scratches were noted to the distal forearm. Patient says that these are from a cat. Possible nidus for the infection.  ----------------------------------------- 8:59 PM on 07/28/2016 -----------------------------------------  Surgery take the patient to the OR for  incision and drainage of the abscess.     ____________________________________________   FINAL CLINICAL IMPRESSION(S) / ED DIAGNOSES  Forearm abscess.    NEW MEDICATIONS STARTED DURING THIS VISIT:  New Prescriptions   No medications on file     Note:  This document was prepared using Dragon voice recognition software and may include unintentional dictation errors.    Orbie Pyo, MD 07/28/16 2100

## 2016-07-28 NOTE — Anesthesia Preprocedure Evaluation (Addendum)
Anesthesia Evaluation  Patient identified by MRN, date of birth, ID band Patient awake    Reviewed: Allergy & Precautions, NPO status , Patient's Chart, lab work & pertinent test results, reviewed documented beta blocker date and time   Airway Mallampati: II  TM Distance: >3 FB     Dental  (+) Chipped   Pulmonary           Cardiovascular      Neuro/Psych    GI/Hepatic   Endo/Other    Renal/GU      Musculoskeletal   Abdominal   Peds  (+) ADHD Hematology   Anesthesia Other Findings   Reproductive/Obstetrics                            Anesthesia Physical Anesthesia Plan  ASA: II  Anesthesia Plan: General   Post-op Pain Management:    Induction: Intravenous  Airway Management Planned: Oral ETT  Additional Equipment:   Intra-op Plan:   Post-operative Plan:   Informed Consent: I have reviewed the patients History and Physical, chart, labs and discussed the procedure including the risks, benefits and alternatives for the proposed anesthesia with the patient or authorized representative who has indicated his/her understanding and acceptance.     Plan Discussed with: CRNA  Anesthesia Plan Comments:         Anesthesia Quick Evaluation

## 2016-07-28 NOTE — Progress Notes (Signed)
Spoke with Dr Adonis Huguenin and Dr Marcello Moores about previous difficulties with anesthesia during past surgeries. Patient's father is with patient and holding jewelry for patient. Nurse was able to remove ring.

## 2016-07-28 NOTE — ED Triage Notes (Signed)
Patient presents to the ED with redness to right arm.  Patient was seen in the ED 2 days ago and diagnosed with cellulitis and told he had a "cyst" in his right arm.  Patient states, "They drew a line and said if the redness went beyond the line to come back and it is beyond the line.  Patient does have redness to his right forearm outside of the drawn line.  Patient states pain to his right arm is severe.  Patient denies any other complaint.

## 2016-07-29 ENCOUNTER — Encounter: Payer: Self-pay | Admitting: General Surgery

## 2016-07-29 DIAGNOSIS — L02413 Cutaneous abscess of right upper limb: Secondary | ICD-10-CM | POA: Diagnosis not present

## 2016-07-29 DIAGNOSIS — L0291 Cutaneous abscess, unspecified: Secondary | ICD-10-CM | POA: Insufficient documentation

## 2016-07-29 HISTORY — DX: Cutaneous abscess, unspecified: L02.91

## 2016-07-29 LAB — BASIC METABOLIC PANEL
ANION GAP: 8 (ref 5–15)
BUN: 14 mg/dL (ref 6–20)
CALCIUM: 9.1 mg/dL (ref 8.9–10.3)
CO2: 25 mmol/L (ref 22–32)
Chloride: 103 mmol/L (ref 101–111)
Creatinine, Ser: 1.05 mg/dL (ref 0.61–1.24)
GFR calc non Af Amer: >60 mL/min (ref >60)
GLUCOSE: 144 mg/dL — AB (ref 65–99)
POTASSIUM: 4.1 mmol/L (ref 3.5–5.1)
Sodium: 136 mmol/L (ref 135–145)

## 2016-07-29 LAB — SURGICAL PCR SCREEN
MRSA, PCR: NEGATIVE
Staphylococcus aureus: POSITIVE — AB

## 2016-07-29 LAB — CBC
HEMATOCRIT: 41.7 % (ref 40.0–52.0)
HEMOGLOBIN: 14.2 g/dL (ref 13.0–18.0)
MCH: 31.2 pg (ref 26.0–34.0)
MCHC: 34.1 g/dL (ref 32.0–36.0)
MCV: 91.4 fL (ref 80.0–100.0)
PLATELETS: 309 10*3/uL (ref 150–440)
RBC: 4.55 MIL/uL (ref 4.40–5.90)
RDW: 12.9 % (ref 11.5–14.5)
WBC: 11.5 10*3/uL — ABNORMAL HIGH (ref 3.8–10.6)

## 2016-07-29 MED ORDER — IBUPROFEN 600 MG PO TABS: 600.0000 mg | ORAL_TABLET | Freq: Three times a day (TID) | ORAL | 0 refills | Status: DC | PRN

## 2016-07-29 MED ORDER — ONDANSETRON HCL 4 MG/2ML IJ SOLN
INTRAMUSCULAR | Status: DC | PRN
  Administered 2016-07-29: 4 mg via INTRAVENOUS

## 2016-07-29 MED ORDER — ONDANSETRON HCL 4 MG/2ML IJ SOLN: 4.0000 mg | Freq: Once | INTRAMUSCULAR | Status: DC | PRN

## 2016-07-29 MED ORDER — LACTATED RINGERS IV SOLN
INTRAVENOUS | Status: DC | PRN
  Administered 2016-07-28: via INTRAVENOUS

## 2016-07-29 MED ORDER — CHLORHEXIDINE GLUCONATE CLOTH 2 % EX PADS
6.0000 | MEDICATED_PAD | Freq: Every day | CUTANEOUS | Status: DC
  Administered 2016-07-29: 6 via TOPICAL

## 2016-07-29 MED ORDER — FENTANYL CITRATE (PF) 100 MCG/2ML IJ SOLN
INTRAMUSCULAR | Status: DC | PRN
  Administered 2016-07-29 (×2): 50 ug via INTRAVENOUS

## 2016-07-29 MED ORDER — OXYCODONE HCL 5 MG PO TABS
5.0000 mg | ORAL_TABLET | ORAL | Status: DC | PRN
Start: 2016-07-29 — End: 2016-07-29

## 2016-07-29 MED ORDER — PROPOFOL 10 MG/ML IV BOLUS
INTRAVENOUS | Status: DC | PRN
Start: 2016-07-29 — End: 2016-07-29
  Administered 2016-07-29: 150 mg via INTRAVENOUS

## 2016-07-29 MED ORDER — SUCCINYLCHOLINE CHLORIDE 20 MG/ML IJ SOLN
INTRAMUSCULAR | Status: DC | PRN
Start: 2016-07-29 — End: 2016-07-29
  Administered 2016-07-29: 100 mg via INTRAVENOUS

## 2016-07-29 MED ORDER — CIPROFLOXACIN HCL 500 MG PO TABS: 500.0000 mg | ORAL_TABLET | Freq: Two times a day (BID) | ORAL | 0 refills | Status: AC

## 2016-07-29 MED ORDER — LIDOCAINE HCL (CARDIAC) 20 MG/ML IV SOLN
INTRAVENOUS | Status: DC | PRN
  Administered 2016-07-29: 100 mg via INTRAVENOUS

## 2016-07-29 MED ORDER — MUPIROCIN 2 % EX OINT
1.0000 "application " | TOPICAL_OINTMENT | Freq: Two times a day (BID) | CUTANEOUS | Status: DC
  Administered 2016-07-29: 1 via NASAL
  Filled 2016-07-29: qty 22

## 2016-07-29 MED ORDER — SUCCINYLCHOLINE CHLORIDE 20 MG/ML IJ SOLN
INTRAMUSCULAR | Status: AC
  Filled 2016-07-29: qty 1

## 2016-07-29 MED ORDER — ONDANSETRON HCL 4 MG/2ML IJ SOLN
INTRAMUSCULAR | Status: AC
  Filled 2016-07-29: qty 2

## 2016-07-29 MED ORDER — FENTANYL CITRATE (PF) 100 MCG/2ML IJ SOLN
INTRAMUSCULAR | Status: AC
  Filled 2016-07-29: qty 2

## 2016-07-29 MED ORDER — DEXAMETHASONE SODIUM PHOSPHATE 10 MG/ML IJ SOLN
INTRAMUSCULAR | Status: DC | PRN
  Administered 2016-07-29: 10 mg via INTRAVENOUS

## 2016-07-29 MED ORDER — FENTANYL CITRATE (PF) 100 MCG/2ML IJ SOLN
25.0000 ug | INTRAMUSCULAR | Status: DC | PRN
  Administered 2016-07-29 (×4): 25 ug via INTRAVENOUS

## 2016-07-29 MED ORDER — OXYCODONE HCL 5 MG PO TABS: 5.0000 mg | ORAL_TABLET | ORAL | 0 refills | Status: DC | PRN

## 2016-07-29 MED ORDER — DEXAMETHASONE SODIUM PHOSPHATE 10 MG/ML IJ SOLN
INTRAMUSCULAR | Status: AC
  Filled 2016-07-29: qty 1

## 2016-07-29 MED ORDER — CIPROFLOXACIN IN D5W 400 MG/200ML IV SOLN
400.0000 mg | Freq: Two times a day (BID) | INTRAVENOUS | Status: DC
  Administered 2016-07-29 (×2): 400 mg via INTRAVENOUS
  Filled 2016-07-29 (×4): qty 200

## 2016-07-29 NOTE — Discharge Summary (Signed)
Patient ID: Brandon Clayton MRN: [id number] DOB/AGE: 1976/05/11 41 y.o.  Admit date: 07/28/2016 Discharge date: 07/29/2016   Discharge Diagnoses:  Active Problems:   Abscess of arm, right   Procedures: Incision and drainage of right arm abscess  Hospital Course: Patient was admitted on 3/15 and went to OR for I&D of right arm abscess.  He tolerated the procedure well and reported significant improvement by POD#1.  There was no further cellulitis and less induration.  Cultures currently growing GPCs and are pending.  Patient was on Ciprofloxacin and will be discharged with a 7 day course.  Consults: None  Disposition: 01-Home or Self Care  Discharge Instructions    Call MD for:  difficulty breathing, headache or visual disturbances    Complete by:  As directed    Call MD for:  persistant nausea and vomiting    Complete by:  As directed    Call MD for:  redness, tenderness, or signs of infection (pain, swelling, redness, odor or green/yellow discharge around incision site)    Complete by:  As directed    Call MD for:  severe uncontrolled pain    Complete by:  As directed    Call MD for:  temperature >100.4    Complete by:  As directed    Change dressing (specify)    Complete by:  As directed    Change dressing once daily:  1/4 inch gauze for packing wound, cover with dry gauze and secure with ACE wrap.   Diet - low sodium heart healthy    Complete by:  As directed    Discharge instructions    Complete by:  As directed    1.  Please remove packing gauze tomorrow 3/17.  Replace with 1/4 inch gauze strip to keep wound open.  Do not need to overpack or jam the gauze in.  Cover with dry gauze and ACE wrap. 2.  Patient may shower, but do not scrub wound heavily and dab dry only. 3.  Do not submerge wound in pool/tub. 4.  May take Tylenol or Advil for pain control in addition to Oxycodone prescription.   Driving Restrictions    Complete by:  As directed    Do not drive  while taking narcotics for pain control.   Increase activity slowly    Complete by:  As directed    Lifting restrictions    Complete by:  As directed    No strenuous activity with right arm until wound heals.  Keep wound clean and dry.     Allergies as of 07/29/2016   No Known Allergies     Medication List    STOP taking these medications   clindamycin 300 MG capsule Commonly known as:  CLEOCIN     TAKE these medications   ciprofloxacin 500 MG tablet Commonly known as:  CIPRO Take 1 tablet (500 mg total) by mouth 2 (two) times daily.   ibuprofen 600 MG tablet Commonly known as:  ADVIL,MOTRIN Take 1 tablet (600 mg total) by mouth every 8 (eight) hours as needed.   oxyCODONE 5 MG immediate release tablet Commonly known as:  Oxy IR/ROXICODONE Take 1-2 tablets (5-10 mg total) by mouth every 4 (four) hours as needed for moderate pain or severe pain.      Follow-up Information    Clayburn Pert, MD Follow up on 08/03/2016.   Specialty:  General Surgery Why:  Appointment scheduled for 3/21 at 11:15 am. Contact information: [place]  [address] [address] [phone]

## 2016-07-29 NOTE — Anesthesia Post-op Follow-up Note (Cosign Needed)
Anesthesia QCDR form completed.        

## 2016-07-29 NOTE — Op Note (Signed)
   Pre-operative Diagnosis: Right forearm abscess  Post-operative Diagnosis: Same  Procedure performed: Incision and drainage of right forearm abscess  Surgeon: Clayburn Pert   Assistants: None  Anesthesia: General endotracheal anesthesia  ASA Class: 1  Surgeon: Clayburn Pert, MD FACS  Anesthesia: Gen. with endotracheal tube  Assistant: None  Procedure Details  The patient was seen again in the Holding Room. The benefits, complications, treatment options, and expected outcomes were discussed with the patient. The risks of bleeding, infection, recurrence of symptoms, failure to resolve symptoms, any of which could require further surgery were reviewed with the patient.   The patient was taken to Operating Room, identified as Brandon Clayton and the procedure verified.  A Time Out was held and the above information confirmed.  Prior to the induction of general anesthesia, antibiotic prophylaxis was administered. VTE prophylaxis was in place. General endotracheal anesthesia was then administered and tolerated well. After the induction, the right arm was prepped with Chloraprep and draped in the sterile fashion. The patient was positioned in the supine position on an armboard.  The procedure began with an ultrasound of the right forearm again identifying the fluid collection consistent with an abscess cavity. The abscess was then accessed with an 18-gauge needle on a syringe which returned purulent fluid. A cruciate incision was made at the site of the needle aspiration. Then using blunt dissection with a hemostat forceps was taken down until the abscess cavity was entered into. Copious amounts of purulent fluid then returned. Additional cultures were taken from the abscess cavity itself. The area was opened bluntly and then irrigated out with normal saline until the irrigant returned clear.  At this point decision was made to pack the abscess cavity. Using quarter inch plain  gauze and the cavity was tightly packed. A dressing of plain gauze and ABD pad placed over this and secured with paper tape. The patient tolerated procedure well. There were no immediate, complications. All counts were correct at the end of the procedure. He was awoken from general anesthesia and transferred to the PACU in good condition.  Findings: Right forearm abscess   Estimated Blood Loss: 10 mL         Drains: None         Specimens: Culture of abscess fluid          Complications: None                  Condition: Good   Clayburn Pert, MD, FACS

## 2016-07-29 NOTE — Progress Notes (Signed)
Discharge instructions along with home medication list and follow up gone over with patient. Patient verbalized that he understood instructions. One printed rx given to patient. Two prescriptions electronically submitted. Dressing material given to patient. Work note also given to patient. Iv removed x1. No c/o pain no distress noted. Dressing to right arm dry and intact. Patient to be discharged home family at bedside

## 2016-07-29 NOTE — Progress Notes (Signed)
07/29/2016  Subjective: Patient is 1 Day Post-Op s/p I&D of right arm abscess. No acute events overnight.  Patient reports much improvement in his pain over the right forearm.  Vital signs: Temp:  [97 F (36.1 C)-98.1 F (36.7 C)] 97.9 F (36.6 C) (03/16 1147) Pulse Rate:  [71-103] 94 (03/16 1147) Resp:  [8-24] 20 (03/16 1147) BP: (108-130)/(61-96) 123/75 (03/16 1147) SpO2:  [95 %-100 %] 99 % (03/16 1147) Weight:  [78.5 kg (173 lb)-81.6 kg (180 lb)] 78.5 kg (173 lb) (03/15 2230)   Intake/Output: 03/15 0701 - 03/16 0700 In: 120 [P.O.:120] Out: 250 [Urine:250] Last BM Date: 07/28/16  Physical Exam: Constitutional:  No acute distress Extremity:  Right arm s/p I&D.  Dressing is clean, dry, with 1/4 inch gauze packing in place.  Less cellulitis.  Improved right hand grip.  Labs:   Recent Labs  07/28/16 1840 07/29/16 0531  WBC 10.2 11.5*  HGB 15.7 14.2  HCT 45.5 41.7  PLT 321 309    Recent Labs  07/28/16 1840 07/29/16 0531  NA 139 136  K 4.0 4.1  CL 104 103  CO2 28 25  GLUCOSE 104* 144*  BUN 13 14  CREATININE 0.96 1.05  CALCIUM 9.6 9.1   No results for input(s): LABPROT, INR in the last 72 hours.  Imaging: No results found.  Assessment/Plan: 41 yo male s/p I&D of right forearm abscess  --continue IV abx for 24 hrs for infection control. --transition to oral abx tomorrow and likely discharge to home tomorrow after dressing change.   Melvyn Neth, [place]

## 2016-07-29 NOTE — Anesthesia Postprocedure Evaluation (Signed)
Anesthesia Post Note  Patient: Brandon Clayton  Procedure(s) Performed: Procedure(s) (LRB): INCISION AND DRAINAGE ABSCESS (Right)  Patient location during evaluation: PACU Anesthesia Type: General Level of consciousness: awake and alert Pain management: pain level controlled Vital Signs Assessment: post-procedure vital signs reviewed and stable Respiratory status: spontaneous breathing, nonlabored ventilation, respiratory function stable and patient connected to nasal cannula oxygen Cardiovascular status: blood pressure returned to baseline and stable Postop Assessment: no signs of nausea or vomiting Anesthetic complications: no     Last Vitals:  Vitals:   07/29/16 0237 07/29/16 0338  BP: 108/67 113/69  Pulse: 73 75  Resp: 18 16  Temp: 36.6 C 36.6 C    Last Pain:  Vitals:   07/29/16 0338  TempSrc: Oral  PainSc:                  Yuvonne Lanahan S

## 2016-07-29 NOTE — Brief Op Note (Signed)
07/28/2016 - 07/29/2016  12:32 AM  PATIENT:  [name]  41 y.o. male  PRE-OPERATIVE DIAGNOSIS:  right arm abscess  POST-OPERATIVE DIAGNOSIS:  Same  PROCEDURE:  Procedure(s): INCISION AND DRAINAGE ABSCESS (Right)  SURGEON:  Surgeon(s) and Role:    * Clayburn Pert, MD - Primary  PHYSICIAN ASSISTANT:   ASSISTANTS: none   ANESTHESIA:   general  EBL:  No intake/output data recorded.  BLOOD ADMINISTERED:none  DRAINS: none   LOCAL MEDICATIONS USED:  NONE  SPECIMEN:  Source of Specimen:  culture of abscess  DISPOSITION OF SPECIMEN:  micro  COUNTS:  YES  TOURNIQUET:  * No tourniquets in log *  DICTATION: .Dragon Dictation  PLAN OF CARE: Admit for overnight observation  PATIENT DISPOSITION:  PACU - hemodynamically stable.   Delay start of Pharmacological VTE agent (>24hrs) due to surgical blood loss or risk of bleeding: no

## 2016-07-29 NOTE — Anesthesia Procedure Notes (Signed)
Procedure Name: Intubation Date/Time: 07/29/2016 12:11 AM Performed by: Aline Brochure Pre-anesthesia Checklist: Patient identified, Emergency Drugs available, Suction available and Patient being monitored Patient Re-evaluated:Patient Re-evaluated prior to inductionOxygen Delivery Method: Circle system utilized Preoxygenation: Pre-oxygenation with 100% oxygen Intubation Type: IV induction and Cricoid Pressure applied Laryngoscope Size: Mac and 4 Grade View: Grade II Tube size: 7.5 mm Number of attempts: 1 Airway Equipment and Method: Stylet Placement Confirmation: positive ETCO2 and breath sounds checked- equal and bilateral Secured at: 22 cm Tube secured with: Tape Dental Injury: Teeth and Oropharynx as per pre-operative assessment

## 2016-07-29 NOTE — Transfer of Care (Signed)
Immediate Anesthesia Transfer of Care Note  Patient: Brandon Clayton  Procedure(s) Performed: Procedure(s): INCISION AND DRAINAGE ABSCESS (Right)  Patient Location: PACU  Anesthesia Type:General  Level of Consciousness: awake, alert  and oriented  Airway & Oxygen Therapy: Patient Spontanous Breathing  Post-op Assessment: Post -op Vital signs reviewed and stable  Post vital signs: stable  Last Vitals:  Vitals:   07/28/16 2230 07/29/16 0050  BP: 125/88 121/88  Pulse: 79 87  Resp: 18 19  Temp: 36.6 C 36.2 C    Last Pain:  Vitals:   07/29/16 0050  TempSrc: Temporal  PainSc:          Complications: No apparent anesthesia complications

## 2016-07-30 LAB — HIV ANTIBODY (ROUTINE TESTING W REFLEX): HIV SCREEN 4TH GENERATION: NONREACTIVE

## 2016-08-01 DIAGNOSIS — L0291 Cutaneous abscess, unspecified: Secondary | ICD-10-CM

## 2016-08-03 ENCOUNTER — Ambulatory Visit (INDEPENDENT_AMBULATORY_CARE_PROVIDER_SITE_OTHER): Payer: Federal, State, Local not specified - PPO | Admitting: General Surgery

## 2016-08-03 ENCOUNTER — Encounter: Payer: Self-pay | Admitting: General Surgery

## 2016-08-03 VITALS — BP 133/96 | HR 92 | Temp 97.8°F | Wt 183.0 lb

## 2016-08-03 DIAGNOSIS — Z4889 Encounter for other specified surgical aftercare: Secondary | ICD-10-CM

## 2016-08-03 LAB — AEROBIC/ANAEROBIC CULTURE (SURGICAL/DEEP WOUND)

## 2016-08-03 LAB — AEROBIC/ANAEROBIC CULTURE W GRAM STAIN (SURGICAL/DEEP WOUND)

## 2016-08-03 NOTE — Progress Notes (Signed)
Outpatient Surgical Follow Up  08/03/2016  Brandon Clayton is an 41 y.o. male.   Chief Complaint  Patient presents with  . Routine Post Op     I&D of Right Forearm Abscess (3/16)- Dr. Adonis Huguenin    HPI: 41 year old male returns to clinic 5 days status post I&D of a right forearm abscess. Patient reports doing much better than before the I&D. He has been gradually pulling out the packing every day since discharge. He denies any fevers, chills, nausea, vomiting, chest pain, short of breath, diarrhea, constipation. He states he's feeling much better.  Past Medical History:  Diagnosis Date  . Abscess 07/29/2016   Right Forearm    Past Surgical History:  Procedure Laterality Date  . INCISION AND DRAINAGE ABSCESS Right 07/28/2016   Procedure: INCISION AND DRAINAGE ABSCESS;  Surgeon: Clayburn Pert, MD;  Location: [facility] ORS;  Service: General;  Laterality: Right;    Family History  Problem Relation Age of Onset  . Diabetes Mother   . Cancer Mother   . Heart disease Mother   . Hyperlipidemia Mother   . Hypertension Mother   . Hyperlipidemia Father     Social History:  reports that he has been smoking Cigarettes.  He has never used smokeless tobacco. He reports that he does not drink alcohol or use drugs.  Allergies: No Known Allergies  Medications reviewed.    ROS A multipoint review of systems was completed, all pertinent positives and negatives are documented within the history of present illness and remainder are negative   BP (!) 133/96   Pulse 92   Temp 97.8 F (36.6 C) (Oral)   Wt 83 kg (183 lb)   BMI 27.02 kg/m   Physical Exam Gen.: No acute distress Chest: Clear to auscultation Heart: Regular rhythm Abdomen: Soft and nontender Extremities: Right upper extremity I&D site examined. Remainder packing removed. No evidence of spreading erythema or purulence.    No results found for this or any previous visit (from the past 48 hour(s)). No results  found.  Assessment/Plan:  1. Aftercare following surgery 41 year old male 5 days status post I&D of right forearm abscess. Doing very well. Discussed signs and symptoms of recurrence of infection and to return to clinic immediately should they occur. Otherwise he is to complete his oral antibiotics and he can follow-up on an as-needed basis.     Clayburn Pert, MD FACS General Surgeon  08/03/2016,11:48 AM

## 2016-08-03 NOTE — Patient Instructions (Signed)
Please give [place] a call if you have any questions or concerns.  Please do not submerge in a tub, hot tub, or pool until incisions are completely sealed.  Use sun block to incision area over the next year if this area will be exposed to sun. This helps decrease scarring.  You may resume your normal activities on 08/04/2016.   If you develop redness, drainage, or pain at incision sites- call our office immediately and speak with a nurse.

## 2016-08-26 DIAGNOSIS — J019 Acute sinusitis, unspecified: Secondary | ICD-10-CM | POA: Diagnosis not present

## 2016-08-26 DIAGNOSIS — B9689 Other specified bacterial agents as the cause of diseases classified elsewhere: Secondary | ICD-10-CM | POA: Diagnosis not present

## 2016-08-26 DIAGNOSIS — R0602 Shortness of breath: Secondary | ICD-10-CM | POA: Diagnosis not present

## 2016-08-26 DIAGNOSIS — J22 Unspecified acute lower respiratory infection: Secondary | ICD-10-CM | POA: Diagnosis not present

## 2017-05-16 HISTORY — PX: CYSTECTOMY: SUR359

## 2017-05-16 HISTORY — PX: LIPOMA EXCISION: SHX5283

## 2017-07-17 ENCOUNTER — Telehealth: Payer: Self-pay | Admitting: General Surgery

## 2017-07-17 NOTE — Telephone Encounter (Signed)
Patients wife is calling, said he husband had surgery over a year ago with Dr. Adonis Huguenin, patient husband is having swelling, numbness in his arm that he had surgery done on she said her husband could come in on Thursday or Friday. Please call patient and advise.

## 2017-07-17 NOTE — Telephone Encounter (Signed)
Call made to patient and patient's wife at this time. I left a message for them both stating to give our office a call back in order to schedule an appointment to be seen.

## 2017-07-18 ENCOUNTER — Ambulatory Visit: Payer: Federal, State, Local not specified - PPO | Admitting: General Surgery

## 2017-07-18 ENCOUNTER — Ambulatory Visit (INDEPENDENT_AMBULATORY_CARE_PROVIDER_SITE_OTHER): Payer: Federal, State, Local not specified - PPO | Admitting: General Surgery

## 2017-07-18 ENCOUNTER — Encounter: Payer: Self-pay | Admitting: General Surgery

## 2017-07-18 VITALS — BP 130/87 | HR 76 | Temp 98.1°F | Ht 69.0 in | Wt 195.0 lb

## 2017-07-18 DIAGNOSIS — D1721 Benign lipomatous neoplasm of skin and subcutaneous tissue of right arm: Secondary | ICD-10-CM | POA: Diagnosis not present

## 2017-07-18 NOTE — Patient Instructions (Signed)
We have scheduled you for a CT Scan of your upper tight extremity. This has been scheduled on 07/26/2017 at our [place] location. Please Check-in at 3:45 PM, 15 minutes prior to your scheduled appointment. If you need to reschedule your Scan, you may do so by calling [phone].  Bring a list of medications with you to your appointment and nothing to eat or drink 4 hours prior to your CT Scan.  Please go to the [facility] and pick up your CT Contrast. They will explain how to take it.

## 2017-07-18 NOTE — Progress Notes (Signed)
Outpatient Surgical Follow Up  07/18/2017  Brandon Clayton is an 42 y.o. male.   Chief Complaint  Patient presents with  . Follow-up    Right Forearm abscess    HPI: 42 year old male who is known to the surgery department due to a prior right upper extremity abscess that was drained approximately 1 year ago returns to clinic for evaluation of right upper extremity discomfort.  Patient reports for the last several weeks he has been having increasing discomfort to just above his right elbow on the medial aspect and on the posterior aspect of his triceps.  There is a throbbing sensation and a palpable area of firmness.  He states he notices the discomfort more when he is active or if he leans or sleeps on the area.  He states he has lost sensation and seems to have lost blood supply to that extremity more since he has noticed these areas especially if he sleeps on them.  The pain is of a throbbing sensation and he can feel his heartbeat.  He denies any fevers, chills, nausea, vomiting, chest pain, shortness of breath, diarrhea, constipation.  He has had some unintended weight gain.  He also has an area that started to bother him in his mid back where he had a previously I&D sebaceous cyst.  He has an identical area of firmness on the left upper extremity triceps region that does not cause him any pain.  Past Medical History:  Diagnosis Date  . Abscess 07/29/2016   Right Forearm    Past Surgical History:  Procedure Laterality Date  . INCISION AND DRAINAGE ABSCESS Right 07/28/2016   Procedure: INCISION AND DRAINAGE ABSCESS;  Surgeon: Clayburn Pert, MD;  Location: [facility] ORS;  Service: General;  Laterality: Right;    Family History  Problem Relation Age of Onset  . Diabetes Mother   . Cancer Mother   . Heart disease Mother   . Hyperlipidemia Mother   . Hypertension Mother   . Hyperlipidemia Father     Social History:  reports that he has been smoking cigarettes.  he has never  used smokeless tobacco. He reports that he does not drink alcohol or use drugs.  Allergies: No Known Allergies  Medications reviewed.    ROS A multipoint review of systems was completed, all pertinent positives and negatives are documented within the HPI and the remainder are negative   BP 130/87   Pulse 76   Temp 98.1 F (36.7 C) (Oral)   Ht 5\' 9"  (1.753 m)   Wt 88.5 kg (195 lb)   BMI 28.80 kg/m   Physical Exam General: No acute distress Neck: Supple and nontender Chest: Clear to auscultation Heart: Regular rate and rhythm Abdomen: Soft nontender Skin: Palpable, rubbery soft tissue mass of the right and left triceps region consistent with lipomas.  The palpable area of firmness just above his right elbow is more difficult to ascertain due to its location and proximity to tendon and vasculature.  Area of mid back consistent with recurrent sebaceous cyst.    No results found for this or any previous visit (from the past 48 hour(s)). No results found.  Assessment/Plan:  1. Lipoma of right upper extremity 42 year old male with likely multiple lipomas and a sebaceous cyst.  Given the area that was difficult to palpate discussed that imaging is indicated to fully elucidate what is going on.  Especially since this is causing a potential vascular compromise.  Most likely this still represents a lipoma  and discussed that they could be removed.  Depending on the image findings of the area above the elbow he could warrant going to the operating room for the removal of all the areas of concern on the same trip.  He will follow-up with 1 of my partners after cross-sectional imaging is obtained. - CT HUMERUS RIGHT W CONTRAST; Future     Clayburn Pert, MD [facility] General Surgeon  07/18/2017,9:13 AM

## 2017-07-26 ENCOUNTER — Ambulatory Visit
Admission: RE | Admit: 2017-07-26 | Discharge: 2017-07-26 | Disposition: A | Discharge status: Home / Self Care | Payer: Federal, State, Local not specified - PPO | Source: Ambulatory Visit | Attending: General Surgery | Admitting: General Surgery

## 2017-07-26 DIAGNOSIS — M79601 Pain in right arm: Secondary | ICD-10-CM | POA: Diagnosis not present

## 2017-07-26 DIAGNOSIS — D1721 Benign lipomatous neoplasm of skin and subcutaneous tissue of right arm: Secondary | ICD-10-CM | POA: Insufficient documentation

## 2017-07-26 DIAGNOSIS — M7989 Other specified soft tissue disorders: Secondary | ICD-10-CM | POA: Diagnosis not present

## 2017-07-26 MED ORDER — IOPAMIDOL (ISOVUE-300) INJECTION 61%
100.0000 mL | Freq: Once | INTRAVENOUS | Status: AC | PRN
  Administered 2017-07-26: 100 mL via INTRAVENOUS

## 2017-07-28 ENCOUNTER — Ambulatory Visit (INDEPENDENT_AMBULATORY_CARE_PROVIDER_SITE_OTHER): Payer: Federal, State, Local not specified - PPO | Admitting: Surgery

## 2017-07-28 ENCOUNTER — Encounter: Payer: Self-pay | Admitting: Surgery

## 2017-07-28 DIAGNOSIS — D1721 Benign lipomatous neoplasm of skin and subcutaneous tissue of right arm: Secondary | ICD-10-CM

## 2017-07-28 NOTE — Patient Instructions (Addendum)
We will schedule you to have your lipoma removed with Dr. Dahlia Byes on 08/07/2017.

## 2017-07-31 NOTE — Progress Notes (Signed)
  Surgical Consultation  07/31/2017  Brandon Clayton is an 42 y.o. male.   Chief Complaint  Patient presents with  . Follow-up    Right Arm     HPI: [place] is very well-known patient to our practice subcutaneous soft tissue masses that are symptomatic.  The worst is the one on his right arm just above the elbow.  He has 2 masses there one on the anterolateral aspect and the other one on the posterior medial aspect.  He also has an EIC on his back that is symptomatic as well. Reports intermittent moderate pain specifically after working and after using his hands on both of the lesions above his right elbow.  As I stated above the more symptomatic is the one on the anterolateral aspect of his arm.  A CT scan was personally reviewed showing no evidence of worrisome lesions.  Because of the same density the lipomas were not clearly visible.  Past Medical History:  Diagnosis Date  . Abscess 07/29/2016   Right Forearm    Past Surgical History:  Procedure Laterality Date  . INCISION AND DRAINAGE ABSCESS Right 07/28/2016   Procedure: INCISION AND DRAINAGE ABSCESS;  Surgeon: Clayburn Pert, MD;  Location: [facility] ORS;  Service: General;  Laterality: Right;    Family History  Problem Relation Age of Onset  . Diabetes Mother   . Cancer Mother   . Heart disease Mother   . Hyperlipidemia Mother   . Hypertension Mother   . Hyperlipidemia Father     Social History:  reports that he has been smoking cigarettes.  he has never used smokeless tobacco. He reports that he does not drink alcohol or use drugs.  Allergies: No Known Allergies  Medications reviewed.  ROS Full ROS performed and is otherwise negative other than what is stated in the HPI    There were no vitals taken for this visit.  Physical Exam  Constitutional: He is oriented to person, place, and time and well-developed, well-nourished, and in no distress. No distress.  Pulmonary/Chest: Effort normal. No  respiratory distress.  Musculoskeletal: Normal range of motion. He exhibits no edema.  Neurological: He is alert and oriented to person, place, and time. Gait normal. GCS score is 15.  Skin: Skin is warm and dry. He is not diaphoretic.  Right anterolateral lesion measuring 3 x 3 cm above the elbow consistent with lipoma.  It is tender to palpation.  There is a second lesion that measures 2 x 2 cm on the posterolateral aspect just above his right elbow. Third lesion on his left scapula measuring 3 x 3 cm consistent with an EIC  Psychiatric: Memory, affect and judgment normal.  Nursing note and vitals reviewed.  Assessment/Plan: Antibiotic soft tissue masses consistent with lipomas on the right arm and with an epidermal inclusion cyst on his back.  Discussed with patient detail about options and he wishes to have excision.  We can certainly perform this under local anesthetic here in the office.  Procedure discussed with the patient in detail.  Risk benefits and possible complications including but not limited to: Bleeding, pain, recurrence and aesthetic deformity.  He understands and wishes to proceed Spent more than 25 minutes in this encounter with greater than 50% of time spent in coordination and counseling of his care Caroleen Hamman, MD [place] Surgeon

## 2017-08-07 ENCOUNTER — Ambulatory Visit: Payer: Federal, State, Local not specified - PPO | Admitting: Surgery

## 2017-08-07 ENCOUNTER — Telehealth: Payer: Self-pay

## 2017-08-07 ENCOUNTER — Other Ambulatory Visit: Payer: Self-pay | Admitting: Surgery

## 2017-08-07 ENCOUNTER — Ambulatory Visit (INDEPENDENT_AMBULATORY_CARE_PROVIDER_SITE_OTHER): Payer: Federal, State, Local not specified - PPO | Admitting: Surgery

## 2017-08-07 DIAGNOSIS — L720 Epidermal cyst: Secondary | ICD-10-CM

## 2017-08-07 DIAGNOSIS — D179 Benign lipomatous neoplasm, unspecified: Secondary | ICD-10-CM | POA: Diagnosis not present

## 2017-08-07 DIAGNOSIS — D1721 Benign lipomatous neoplasm of skin and subcutaneous tissue of right arm: Secondary | ICD-10-CM | POA: Diagnosis not present

## 2017-08-07 DIAGNOSIS — L723 Sebaceous cyst: Secondary | ICD-10-CM | POA: Diagnosis not present

## 2017-08-07 DIAGNOSIS — L728 Other follicular cysts of the skin and subcutaneous tissue: Secondary | ICD-10-CM | POA: Diagnosis not present

## 2017-08-07 NOTE — Patient Instructions (Signed)
Please see your follow up appointment listed below.    We have removed a Cyst in our office today.  You have sutures under the skin that will dissolve and also dermabond (skin glue) on top of your skin which will come off on it's own in 10-14 days.  You may shower in 48 hours, this is on 08/09/2017.  Avoid Strenuous activities that will make you sweat during the next 48 hours to avoid the glue coming off prematurely. Avoid activities that will place pressure to this area of the body for 1-2 weeks to avoid re-injury to incision site.  Please see your follow-up appointment provided. We will see you back in office to make sure this area is healed and to review the final pathology. If you have any questions or concerns prior to this appointment, call our office and speak with a nurse.    Excision of Skin Cysts or Lesions Excision of a skin lesion refers to the removal of a section of skin by making small cuts (incisions) in the skin. This procedure may be done to remove a cancerous (malignant) or noncancerous (benign) growth on the skin. It is typically done to treat or prevent cancer or infection. It may also be done to improve cosmetic appearance. The procedure may be done to remove:  Cancerous growths, such as basal cell carcinoma, squamous cell carcinoma, or melanoma.  Noncancerous growths, such as a cyst or lipoma.  Growths, such as moles or skin tags, which may be removed for cosmetic reasons.  Various excision or surgical techniques may be used depending on your condition, the location of the lesion, and your overall health. Tell a health care provider about:  Any allergies you have.  All medicines you are taking, including vitamins, herbs, eye drops, creams, and over-the-counter medicines.  Any problems you or family members have had with anesthetic medicines.  Any blood disorders you have.  Any surgeries you have had.  Any medical conditions you have.  Whether you are  pregnant or may be pregnant. What are the risks? Generally, this is a safe procedure. However, problems may occur, including:  Bleeding.  Infection.  Scarring.  Recurrence of the cyst, lipoma, or cancer.  Changes in skin sensation or appearance, such as discoloration or swelling.  Reaction to the anesthetics.  Allergic reaction to surgical materials or ointments.  Damage to nerves, blood vessels, muscles, or other structures.  Continued pain.  What happens before the procedure?  Ask your health care provider about: ? Changing or stopping your regular medicines. This is especially important if you are taking diabetes medicines or blood thinners. ? Taking medicines such as aspirin and ibuprofen. These medicines can thin your blood. Do not take these medicines before your procedure if your health care provider instructs you not to.  You may be asked to take certain medicines.  You may be asked to stop smoking.  You may have an exam or testing.  Plan to have someone take you home after the procedure.  Plan to have someone help you with activities during recovery. What happens during the procedure?  To reduce your risk of infection: ? Your health care team will wash or sanitize their hands. ? Your skin will be washed with soap.  You will be given a medicine to numb the area (local anesthetic).  One of the following excision techniques will be performed.  At the end of any of these procedures, antibiotic ointment will be applied as needed. Each of the  following techniques may vary among health care providers and hospitals. Complete Surgical Excision The area of skin that needs to be removed will be marked with a pen. Using a small scalpel or scissors, the surgeon will gently cut around and under the lesion until it is completely removed. The lesion will be placed in a fluid and sent to the lab for examination. If necessary, bleeding will be controlled with a device that  delivers heat (electrocautery). The edges of the wound may be stitched (sutured) together, and a bandage (dressing) will be applied. This procedure may be performed to treat a cancerous growth or a noncancerous cyst or lesion. Excision of a Cyst The surgeon will make an incision on the cyst. The entire cyst will be removed through the incision. The incision may be closed with sutures. Shave Excision During shave excision, the surgeon will use a small blade or an electrically heated loop instrument to shave off the lesion. This may be done to remove a mole or a skin tag. The wound will usually be left to heal on its own without sutures. Punch Excision During punch excision, the surgeon will use a small tool that is like a cookie cutter or a hole punch to cut a circle shape out of the skin. The outer edges of the skin will be sutured together. This may be done to remove a mole or a scar or to perform a biopsy of the lesion. Mohs Micrographic Surgery During Mohs micrographic surgery, layers of the lesion will be removed with a scalpel or a loop instrument and will be examined right away under a microscope. Layers will be removed until all of the abnormal or cancerous tissue has been removed. This procedure is minimally invasive, and it ensures the best cosmetic outcome. It involves the removal of as little normal tissue as possible. Mohs is usually done to treat skin cancer, such as basal cell carcinoma or squamous cell carcinoma, particularly on the face and ears. Depending on the size of the surgical wound, it may be sutured closed. What happens after the procedure?  Return to your normal activities as told by your health care provider.  Talk with your health care provider to discuss any test results, treatment options, and if necessary, the need for more tests. This information is not intended to replace advice given to you by your health care provider. Make sure you discuss any questions you have with  your health care provider. Document Released: 07/27/2009 Document Revised: 10/08/2015 Document Reviewed: 06/18/2014 Elsevier Interactive Patient Education  [employer].     Today we have removed a Lipoma in our office. Please see information below regarding this type of tumor.  You are free to shower in 48 hours. This will be on 08/09/17.  You have glue on your skin and sutures under the skin. The glue will come off on it's own in 10-14 days. You may shower normally until this occurs but do not submerge.  Please use Tylenol or Ibuprofen for pain as needed.  We will see you back in 2 weeks to ensure that this has healed and to review the final pathology. Please see your appointment below. You may continue your regular activities right away but if you are having pain while doing something, stop what you are doing and try this activity once again in 3 days. Please call our office with any questions or concerns prior to your appointment.   Lipoma Removal Lipoma removal is a surgical procedure to  remove a noncancerous (benign) tumor that is made up of fat cells (lipoma). Most lipomas are small and painless and do not require treatment. They can form in many areas of the body but are most common under the skin of the back, shoulders, arms, and thighs. You may need lipoma removal if you have a lipoma that is large, growing, or causing discomfort. Lipoma removal may also be done for cosmetic reasons. Tell a health care provider about:  Any allergies you have.  All medicines you are taking, including vitamins, herbs, eye drops, creams, and over-the-counter medicines.  Any problems you or family members have had with anesthetic medicines.  Any blood disorders you have.  Any surgeries you have had.  Any medical conditions you have.  Whether you are pregnant or may be pregnant. What are the risks? Generally, this is a safe procedure. However, problems may occur,  including:  Infection.  Bleeding.  Allergic reactions to medicines.  Damage to nerves or blood vessels near the lipoma.  Scarring.  What happens before the procedure? Staying hydrated Follow instructions from your health care provider about hydration, which may include:  Up to 2 hours before the procedure - you may continue to drink clear liquids, such as water, clear fruit juice, black coffee, and plain tea.  Eating and drinking restrictions Follow instructions from your health care provider about eating and drinking, which may include:  8 hours before the procedure - stop eating heavy meals or foods such as meat, fried foods, or fatty foods.  6 hours before the procedure - stop eating light meals or foods, such as toast or cereal.  6 hours before the procedure - stop drinking milk or drinks that contain milk.  2 hours before the procedure - stop drinking clear liquids.  Medicines  Ask your health care provider about: ? Changing or stopping your regular medicines. This is especially important if you are taking diabetes medicines or blood thinners. ? Taking medicines such as aspirin and ibuprofen. These medicines can thin your blood. Do not take these medicines before your procedure if your health care provider instructs you not to.  You may be given antibiotic medicine to help prevent infection. General instructions  Ask your health care provider how your surgical site will be marked or identified.  You will have a physical exam. Your health care provider will check the size of the lipoma and whether it can be moved easily.  You may have imaging tests, such as: ? X-rays. ? CT scan. ? MRI.  Plan to have someone take you home from the hospital or clinic. What happens during the procedure?  To reduce your risk of infection: ? Your health care team will wash or sanitize their hands. ? Your skin will be washed with soap.  You will be given one or more of the  following: ? A medicine to help you relax (sedative). ? A medicine to numb the area (local anesthetic). ? A medicine to make you fall asleep (general anesthetic). ? A medicine that is injected into an area of your body to numb everything below the injection site (regional anesthetic).  An incision will be made over the lipoma or very near the lipoma. The incision may be made in a natural skin line or crease.  Tissues, nerves, and blood vessels near the lipoma will be moved out of the way.  The lipoma and the capsule that surrounds it will be separated from the surrounding tissues.  The lipoma will  be removed.  The incision may be closed with stitches (sutures).  A bandage (dressing) will be placed over the incision. What happens after the procedure?  Do not drive for 24 hours if you received a sedative.  Your blood pressure, heart rate, breathing rate, and blood oxygen level will be monitored until the medicines you were given have worn off. This information is not intended to replace advice given to you by your health care provider. Make sure you discuss any questions you have with your health care provider. Document Released: 07/16/2015 Document Revised: 10/08/2015 Document Reviewed: 07/16/2015 Elsevier Interactive Patient Education  2018 [employer]

## 2017-08-07 NOTE — Telephone Encounter (Signed)
[facility] notifed of specimen pick-up. Had to leave message on VM. Confirmation [id number]. Specimen packed for transport and placed in white drop box and hung over door.

## 2017-08-09 ENCOUNTER — Encounter: Payer: Self-pay | Admitting: Surgery

## 2017-08-09 NOTE — Progress Notes (Signed)
PROCEDURE NOTE  Diagnosis: Soft tissue masses x 2 right upper arm, one soft tissue mass back.  Procedures: 1. Excision of 3.5 cm Right upper arm deep tissue mass ( subfascial) Above Elbow 2.  Intermediate wound  closure right upper arm 3.5 cm  3.  Excision of a 2.5 cm subcutaneous mass right upper arm 4.  Intermediate wound closure right upper arm 2.5 cm 5.  Excision of subcutaneous tissue mass 3.5 cm back 6.  Intermediate wound closure back 4.5 cm  Anesthesia: lidocaine 1% w epi  Findings: EIC back and two lipoma on Right the arm , one of deep deep and below fascia  EBL: 5 cc  Complications: none  Informed consent was obtained patient was prepped and draped in the usual sterile fashion. She was turned to the right arm well with first localized the anterior lesion using lidocaine 1% with epinephrine was infiltrated the subcutaneous tissue and the skin and created an incision.  Using Metzenbaum scissors we dissected the subcutaneous tissue.  We noticed that the mass was deep and the fascia was identified and excised.  The subcutaneous mass was again dissected free from adjacent structures.  Electrocautery was used to obtain hemostasis.  The wound was closed in a 2 layer fashion with 3-0 Vicryl and 4-0 Monocryl for the skin.  Attention then was turned to the posterior right upper arm mass and using local anesthetic we infiltrated the skin.  An incision was created with a 15 blade knife and the mass was dissected free from adjacent structures using Metzenbaum scissors.  The wound was closed in a 2 layer fashion with 3-0 Vicryl and a 4-0 Monocryl.  Last mass was the back.  Patient was placed in a prone position.  Local anesthetic injected and 15 blade knife used to perform an elliptical incision incorporating the cyst.  This is had significant inflammatory response and we were able to dissect its wall from adjacent structures using Metzenbaum scissors.  The wound was closed in 2 layers with a  3-0 Vicryl for the dermis and 4-0 Monocryl for the skin.  Please note that we used Dermabond to coat all the skin incisions.  No immediate complications

## 2017-08-12 LAB — PATHOLOGY

## 2017-08-16 ENCOUNTER — Telehealth: Payer: Self-pay

## 2017-08-16 NOTE — Telephone Encounter (Signed)
Left message for patient to return call to office regarding lab results.

## 2017-08-16 NOTE — Telephone Encounter (Signed)
Patient notified of pathology results. Reminded to keep follow up appointment on 09/04/17.

## 2017-08-21 ENCOUNTER — Telehealth: Payer: Self-pay

## 2017-08-21 NOTE — Telephone Encounter (Signed)
-----   Message from Jules Husbands, MD sent at 08/21/2017 12:03 PM EDT ----- Please let him know that the path results are benign, lipomas. ----- Message ----- From: Interface, [facility] Lab Results In Sent: 08/15/2017   2:15 PM To: Jules Husbands, MD

## 2017-08-21 NOTE — Telephone Encounter (Signed)
Error

## 2017-08-21 NOTE — Telephone Encounter (Signed)
Called patient but had to leave him a detailed message letting him know that his pathology came back as a lipoma for all three sites (benign) and to follow up with [place] on his scheduled date and time.

## 2017-08-30 ENCOUNTER — Encounter: Payer: Federal, State, Local not specified - PPO | Admitting: Surgery

## 2017-09-04 ENCOUNTER — Ambulatory Visit (INDEPENDENT_AMBULATORY_CARE_PROVIDER_SITE_OTHER): Payer: Federal, State, Local not specified - PPO | Admitting: Surgery

## 2017-09-04 ENCOUNTER — Encounter: Payer: Self-pay | Admitting: Surgery

## 2017-09-04 VITALS — BP 124/82 | HR 70 | Temp 97.7°F | Ht 69.0 in | Wt 195.2 lb

## 2017-09-04 DIAGNOSIS — Z09 Encounter for follow-up examination after completed treatment for conditions other than malignant neoplasm: Secondary | ICD-10-CM

## 2017-09-04 NOTE — Progress Notes (Signed)
09/04/2017  HPI: Patient is s/p excision of two right upper extremity masses and an upper back mass with Dr. Dahlia Byes on 3/25.  He presents for follow up.  Denies any issues with the wounds.  Reports that some of the sensory issues with right arm have improved after the lipoma removal.  Denies any fevers, chills, worsening pain, drainage, or infection.  Vital signs: BP 124/82   Pulse 70   Temp 97.7 F (36.5 C) (Oral)   Ht 5\' 9"  (1.753 m)   Wt 195 lb 3.2 oz (88.5 kg)   BMI 28.83 kg/m    Physical Exam: Constitutional: No acute distress Skin:  Three incisions healing well over ventral and dorsal right upper arm and upper back.  No wound breakdown, drainage, or evidence of infection.  Assessment/Plan: 42 yo male s/p excision of two right upper arm and one upper back masses  --patient healing well. --pathology reviewed with patient again -- angiolipoma of posterior right arm, lipoma of anterior right arm, and epidermal inclusion cyst of upper back. --patient may follow up prn.   Melvyn Neth, [place]

## 2017-09-04 NOTE — Patient Instructions (Signed)
Please call our office if you have questions or concerns.   

## 2017-09-19 ENCOUNTER — Encounter: Payer: Self-pay | Admitting: Family Medicine

## 2017-09-19 ENCOUNTER — Ambulatory Visit: Payer: Federal, State, Local not specified - PPO | Admitting: Family Medicine

## 2017-09-19 VITALS — BP 122/82 | HR 80 | Temp 98.6°F | Ht 69.0 in | Wt 195.4 lb

## 2017-09-19 DIAGNOSIS — G8929 Other chronic pain: Secondary | ICD-10-CM

## 2017-09-19 DIAGNOSIS — Z23 Encounter for immunization: Secondary | ICD-10-CM | POA: Diagnosis not present

## 2017-09-19 DIAGNOSIS — M25511 Pain in right shoulder: Secondary | ICD-10-CM

## 2017-09-19 DIAGNOSIS — R202 Paresthesia of skin: Secondary | ICD-10-CM | POA: Diagnosis not present
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
## 2017-09-19 NOTE — Patient Instructions (Addendum)
Health Maintenance Due  Topic Date Due  . TETANUS/TDAP - Today at office visit before you leave 02/23/1996   Our team will give you a handout from the sports medicine advisor for rotator cuff exercises that I want you to do 2 to 3 to times a week for a month then weekly for a month.  Lets also try activity modification at work.  Lets go ahead and schedule a physical in 3 to 6 months we can check back in.  If you are not improving we could consider sports medicine referral at that time as well as x-rays either with [place] or sports medicine.

## 2017-09-29 NOTE — Addendum Note (Signed)
Addended by: Lucianne Lei M on: 09/29/2017 10:31 AM   Modules accepted: Orders

## 2017-11-25 ENCOUNTER — Encounter: Payer: Self-pay | Admitting: Emergency Medicine

## 2017-11-25 ENCOUNTER — Other Ambulatory Visit: Payer: Self-pay

## 2017-11-25 DIAGNOSIS — R197 Diarrhea, unspecified: Secondary | ICD-10-CM | POA: Insufficient documentation

## 2017-11-25 DIAGNOSIS — R112 Nausea with vomiting, unspecified: Secondary | ICD-10-CM | POA: Diagnosis not present

## 2017-11-25 DIAGNOSIS — R1031 Right lower quadrant pain: Secondary | ICD-10-CM | POA: Diagnosis not present

## 2017-11-25 DIAGNOSIS — R109 Unspecified abdominal pain: Secondary | ICD-10-CM | POA: Diagnosis not present

## 2017-11-25 LAB — COMPREHENSIVE METABOLIC PANEL
ALBUMIN: 4.2 g/dL (ref 3.5–5.0)
ALT: 48 U/L — ABNORMAL HIGH (ref 0–44)
ANION GAP: 6 (ref 5–15)
AST: 29 U/L (ref 15–41)
Alkaline Phosphatase: 78 U/L (ref 38–126)
BILIRUBIN TOTAL: 0.8 mg/dL (ref 0.3–1.2)
BUN: 18 mg/dL (ref 6–20)
CO2: 24 mmol/L (ref 22–32)
Calcium: 8.7 mg/dL — ABNORMAL LOW (ref 8.9–10.3)
Chloride: 110 mmol/L (ref 98–111)
Creatinine, Ser: 0.88 mg/dL (ref 0.61–1.24)
Glucose, Bld: 94 mg/dL (ref 70–99)
POTASSIUM: 4 mmol/L (ref 3.5–5.1)
Sodium: 140 mmol/L (ref 135–145)
TOTAL PROTEIN: 7.3 g/dL (ref 6.5–8.1)

## 2017-11-25 LAB — URINALYSIS, COMPLETE (UACMP) WITH MICROSCOPIC
BACTERIA UA: NONE SEEN
BILIRUBIN URINE: NEGATIVE
GLUCOSE, UA: NEGATIVE mg/dL
HGB URINE DIPSTICK: NEGATIVE
Ketones, ur: NEGATIVE mg/dL
LEUKOCYTES UA: NEGATIVE
NITRITE: NEGATIVE
Protein, ur: NEGATIVE mg/dL
Specific Gravity, Urine: 1.019 (ref 1.005–1.030)
Squamous Epithelial / LPF: NONE SEEN (ref 0–5)
pH: 5 (ref 5.0–8.0)

## 2017-11-25 LAB — CBC
HEMATOCRIT: 44.5 % (ref 40.0–52.0)
HEMOGLOBIN: 15.1 g/dL (ref 13.0–18.0)
MCH: 31.8 pg (ref 26.0–34.0)
MCHC: 34 g/dL (ref 32.0–36.0)
MCV: 93.5 fL (ref 80.0–100.0)
Platelets: 313 10*3/uL (ref 150–440)
RBC: 4.76 MIL/uL (ref 4.40–5.90)
RDW: 13 % (ref 11.5–14.5)
WBC: 8.9 10*3/uL (ref 3.8–10.6)

## 2017-11-25 LAB — LIPASE, BLOOD: Lipase: 34 U/L (ref 11–51)

## 2017-11-25 NOTE — ED Triage Notes (Signed)
Pt c/o right lower quadrant pain since last night; pain has been constant; now with mild nausea; no vomiting; denies diarrhea; denies urinary symptoms; denies fever;

## 2017-11-26 ENCOUNTER — Encounter: Payer: Self-pay | Admitting: Radiology

## 2017-11-26 ENCOUNTER — Emergency Department
Admission: EM | Admit: 2017-11-26 | Discharge: 2017-11-26 | Disposition: A | Discharge status: Home / Self Care | Payer: Federal, State, Local not specified - PPO | Attending: Emergency Medicine | Admitting: Emergency Medicine

## 2017-11-26 ENCOUNTER — Emergency Department: Payer: Federal, State, Local not specified - PPO

## 2017-11-26 DIAGNOSIS — R109 Unspecified abdominal pain: Secondary | ICD-10-CM | POA: Diagnosis not present

## 2017-11-26 DIAGNOSIS — R1031 Right lower quadrant pain: Secondary | ICD-10-CM

## 2017-11-26 DIAGNOSIS — R112 Nausea with vomiting, unspecified: Secondary | ICD-10-CM

## 2017-11-26 DIAGNOSIS — R197 Diarrhea, unspecified: Secondary | ICD-10-CM

## 2017-11-26 MED ORDER — IOHEXOL 300 MG/ML  SOLN
100.0000 mL | Freq: Once | INTRAMUSCULAR | Status: AC | PRN
  Administered 2017-11-26: 100 mL via INTRAVENOUS

## 2017-11-26 MED ORDER — MORPHINE SULFATE (PF) 4 MG/ML IV SOLN
4.0000 mg | Freq: Once | INTRAVENOUS | Status: AC
  Administered 2017-11-26: 4 mg via INTRAVENOUS
  Filled 2017-11-26: qty 1

## 2017-11-26 MED ORDER — SODIUM CHLORIDE 0.9 % IV BOLUS
1000.0000 mL | Freq: Once | INTRAVENOUS | Status: AC
  Administered 2017-11-26: 1000 mL via INTRAVENOUS

## 2017-11-26 MED ORDER — ONDANSETRON HCL 4 MG/2ML IJ SOLN
4.0000 mg | Freq: Once | INTRAMUSCULAR | Status: AC
  Administered 2017-11-26: 4 mg via INTRAVENOUS
  Filled 2017-11-26: qty 2

## 2017-11-26 MED ORDER — KETOROLAC TROMETHAMINE 30 MG/ML IJ SOLN
30.0000 mg | Freq: Once | INTRAMUSCULAR | Status: AC
  Administered 2017-11-26: 30 mg via INTRAVENOUS
  Filled 2017-11-26: qty 1

## 2017-11-26 NOTE — Discharge Instructions (Addendum)
Please follow-up with your primary care physician or return to the emergency department if your pain should persist over the next 24 hours.
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
## 2017-11-26 NOTE — ED Notes (Signed)
Patient c/o QZE abdominal pain beginning Friday. Patient c/o N/V for same, with one emesis in the last 24 hours. Patient c/o diarrhea beginning last night, with 3 episodes in the last 24 hours. Patient reports that pain lessens with pressure/palpation of lower right quadrant.

## 2017-11-26 NOTE — ED Notes (Signed)
ED Provider at bedside. 

## 2017-11-26 NOTE — ED Notes (Signed)
Patient transported to CT 

## 2017-11-26 NOTE — ED Notes (Signed)
Patient up to bathroom

## 2017-11-28 ENCOUNTER — Ambulatory Visit: Payer: Federal, State, Local not specified - PPO | Admitting: Family Medicine

## 2017-11-28 ENCOUNTER — Encounter: Payer: Self-pay | Admitting: Family Medicine

## 2017-11-28 VITALS — BP 116/88 | HR 75 | Temp 98.1°F | Ht 69.0 in | Wt 192.6 lb

## 2017-11-28 DIAGNOSIS — G8929 Other chronic pain: Secondary | ICD-10-CM

## 2017-11-28 DIAGNOSIS — R202 Paresthesia of skin: Secondary | ICD-10-CM | POA: Diagnosis not present

## 2017-11-28 DIAGNOSIS — M25511 Pain in right shoulder: Secondary | ICD-10-CM | POA: Diagnosis not present

## 2017-11-28 NOTE — Progress Notes (Signed)
Subjective:  Brandon Clayton is a 42 y.o. year old very pleasant male patient who presents for/with See problem oriented charting ROS-  No fever or chills. Has had continued diarrhea but improving. No recent vomiting. No chest pain. Still with RLQ pain   Past Medical History-  Patient Active Problem List   Diagnosis Date Noted  . Paresthesias 11/28/2017    Priority: Medium  . Lipoma of right upper extremity 07/18/2017    Priority: Low  . Abscess 07/29/2016    Priority: Low    Medications- reviewed and updated No current outpatient medications on file.   No current facility-administered medications for this visit.     Objective: BP 116/88 (BP Location: Left Arm, Patient Position: Sitting, Cuff Size: Large)   Pulse 75   Temp 98.1 F (36.7 C) (Oral)   Ht 5\' 9"  (1.753 m)   Wt 192 lb 9.6 oz (87.4 kg)   SpO2 99%   BMI 28.44 kg/m  Gen: NAD, resting comfortably CV: RRR no murmurs rubs or gallops Lungs: CTAB no crackles, wheeze, rhonchi Abdomen: soft/nontender throughout abdomen other than moderate pain with deep palpation in RLQ/nondistended/normal bowel sounds. No rebound or guarding.  Ext: no edema Skin: warm, dry  Assessment/Plan:  ED follow up abdominal pain/diarrhea S:  patient was in ER 7/13 through 7/14 for abdominal pain. Pain started suddently and ramped up within 3 hours with pain in RLQ- then had some diarrhea and vomiting in emergency room. Pian up to 8/10. Afebrile. He was worried about appendicitis. CT scan from 11/26/17 was reassuring with normal appendix and no acute abdominal or pelvic abnormality. Blood work was reassuring including UA, CBC, CMP (other than hair high ALT), lipase. He was given morphine, zofran, and normal saline along with toradol. He was advised 24 hour abdominal pain recheck- possibility of viral gastroenteritis remains. Colitis and appendicitis obviously far less likely with normal CT. Apparently years ago had some twisting of  intestines around 18 or 19 but less likely that as was passing stool- prior was not.   Pain down to 3-4/10. Diarrhea down to 3 episodes today from around 10 yesterday A/P: suspect abdominal pain from severe gastroenteritis - advised no work for 24 hours after last episode of diarrhea- he declines need for letter for work - from [employer] like a really really bad stomach bug/viral gastroenteritis. You had an excellent (though prolonged) ER evaluation including CT scan to rule out things like appendix or diverticulitis or colitis. If you do not continue to improve or diarrhea lasts more than a week total I want to see you back. Keep up great job with hydration "  Paresthesias S: continued right hand paresthesias. Changing # of trays at work didn't help resolve issue. Resolves with lifting hand overhead. Also has right shoulder pain still more posterior A/P: patient requests referral to Brandon Clayton given persistent issues- referred today and patient will schedule   Future Appointments  Date Time Provider [place]  12/04/2017  9:00 AM Brandon Diss, DO [facility]   Lab/Order associations: Paresthesias - Plan: Ambulatory referral to Sports Medicine  Chronic right shoulder pain - Plan: Ambulatory referral to Sports Medicine  Return precautions advised.  Brandon Reddish, MD

## 2017-11-28 NOTE — Assessment & Plan Note (Signed)
S: continued right hand paresthesias. Changing # of trays at work didn't help resolve issue. Resolves with lifting hand overhead. Also has right shoulder pain still more posterior A/P: patient requests referral to Dr. Paulla Fore given persistent issues- referred today and patient will schedule

## 2017-11-28 NOTE — Patient Instructions (Signed)
Sounds like a really really bad stomach bug/viral gastroenteritis. You had an excellent (though prolonged) ER evaluation including CT scan to rule out things like appendix or diverticulitis or colitis. If you do not continue to improve or diarrhea lasts more than a week total I want to see you back. Keep up great job with hydration  Please schedule a visit with our excellent sports medicine physician Dr. Paulla Fore before you leave at the check out desk so he can further evaluate your numbness/tingling in right hand

## 2017-12-04 ENCOUNTER — Encounter: Payer: Self-pay | Admitting: Sports Medicine

## 2017-12-04 ENCOUNTER — Ambulatory Visit: Payer: Federal, State, Local not specified - PPO | Admitting: Sports Medicine

## 2017-12-04 ENCOUNTER — Ambulatory Visit (INDEPENDENT_AMBULATORY_CARE_PROVIDER_SITE_OTHER): Payer: Federal, State, Local not specified - PPO

## 2017-12-04 VITALS — BP 124/82 | HR 79 | Resp 16 | Ht 69.0 in | Wt 194.0 lb

## 2017-12-04 DIAGNOSIS — G8929 Other chronic pain: Secondary | ICD-10-CM

## 2017-12-04 DIAGNOSIS — M25511 Pain in right shoulder: Secondary | ICD-10-CM

## 2017-12-04 DIAGNOSIS — R202 Paresthesia of skin: Secondary | ICD-10-CM

## 2017-12-04 DIAGNOSIS — M47812 Spondylosis without myelopathy or radiculopathy, cervical region: Secondary | ICD-10-CM | POA: Diagnosis not present

## 2017-12-04 DIAGNOSIS — M4722 Other spondylosis with radiculopathy, cervical region: Secondary | ICD-10-CM | POA: Diagnosis not present

## 2017-12-04 MED ORDER — GABAPENTIN 300 MG PO CAPS: ORAL_CAPSULE | ORAL | 1 refills | Status: DC
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
## 2017-12-04 NOTE — Patient Instructions (Addendum)
Please start a B complex vitamin.  Take 1 tablet once daily  If you need further restrictions at work please let me know.   Also check out [employer]" which is a program developed by Dr. Minerva Ends.   There are links to a couple of his YouTube Videos below and I would like to see performing one of his videos 5-6 days per week.    A good intro video is: "Independence from Pain 7-minute Video" - [email or web address]   Exercises that focus more on the neck are as below: Dr. Archie Balboa with [place] teaching neck and shoulder details Part 1 - https://[email or web address].be/[email or web address] Part 2 Dr. Archie Balboa with [facility] quick routine to practice daily - https://youtu.be/[email or web address]  Do not try to attempt the entire video when first beginning.    Try breaking of each exercise that he goes into shorter segments.  Otherwise if they perform an exercise for 45 seconds, start with 15 seconds and rest and then resume when they begin the new activity.  If you work your way up to being able to do these videos without having to stop, I expect you will see significant improvements in your pain.  If you enjoy his videos and would like to find out more you can look on his website: [email or web address]  He has a workout streaming option as well as a DVD set available for purchase.  Amazon has the best price for his DVDs.

## 2018-01-23 ENCOUNTER — Ambulatory Visit: Payer: Federal, State, Local not specified - PPO | Admitting: Sports Medicine

## 2018-02-06 ENCOUNTER — Other Ambulatory Visit: Payer: Self-pay | Admitting: Sports Medicine

## 2018-03-23 ENCOUNTER — Other Ambulatory Visit: Payer: Self-pay | Admitting: Sports Medicine

## 2018-03-29 ENCOUNTER — Encounter: Payer: Self-pay | Admitting: Family Medicine

## 2018-03-29 ENCOUNTER — Ambulatory Visit (INDEPENDENT_AMBULATORY_CARE_PROVIDER_SITE_OTHER): Payer: Federal, State, Local not specified - PPO | Admitting: Sports Medicine

## 2018-03-29 ENCOUNTER — Ambulatory Visit: Payer: Self-pay

## 2018-03-29 ENCOUNTER — Ambulatory Visit: Payer: Federal, State, Local not specified - PPO | Admitting: Family Medicine

## 2018-03-29 ENCOUNTER — Ambulatory Visit (INDEPENDENT_AMBULATORY_CARE_PROVIDER_SITE_OTHER): Payer: Federal, State, Local not specified - PPO

## 2018-03-29 ENCOUNTER — Encounter: Payer: Self-pay | Admitting: Sports Medicine

## 2018-03-29 VITALS — BP 120/88 | HR 82 | Temp 98.4°F | Ht 69.0 in | Wt 194.0 lb

## 2018-03-29 VITALS — BP 120/88 | HR 82 | Ht 69.0 in | Wt 194.0 lb

## 2018-03-29 DIAGNOSIS — M25572 Pain in left ankle and joints of left foot: Secondary | ICD-10-CM

## 2018-03-29 DIAGNOSIS — M25472 Effusion, left ankle: Secondary | ICD-10-CM | POA: Diagnosis not present

## 2018-03-29 DIAGNOSIS — S93402A Sprain of unspecified ligament of left ankle, initial encounter: Secondary | ICD-10-CM | POA: Diagnosis not present
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
## 2018-03-29 NOTE — Progress Notes (Signed)
Subjective:  Brandon Clayton is a 42 y.o. year old very pleasant male patient who presents for/with See problem oriented charting ROS- left ankle pain and swelling. Unable to walk on this due to pain.    Past Medical History-  Patient Active Problem List   Diagnosis Date Noted  . Paresthesias 11/28/2017    Priority: Medium  . Lipoma of right upper extremity 07/18/2017    Priority: Low  . Abscess 07/29/2016    Priority: Low  . Osteoarthritis of spine with radiculopathy, cervical region 12/04/2017    Medications- reviewed and updated Current Outpatient Medications  Medication Sig Dispense Refill  . gabapentin (NEURONTIN) 300 MG capsule TAKE 1 CAPSULE BY MOUTH THREE TIMES A DAY OR AS DIRECTED 270 capsule 0   Objective: BP 120/88 (BP Location: Left Arm, Patient Position: Sitting, Cuff Size: Normal)   Pulse 82   Temp 98.4 F (36.9 C) (Oral)   Ht 5\' 9"  (1.753 m)   Wt 194 lb (88 kg)   SpO2 95%   BMI 28.65 kg/m  Gen: NAD, resting comfortably CV: RRR  Lungs: nonlabored, normal respiratory rate Abdomen: soft/nondistended Ext: no edema Skin: warm, dry MSK: patient very tender around anterior and posterior portion of lateral malleolus. Mild to moderate tenderness around medial malleolus. Swelling noted around medial malleolus. He is very stiff and makes for difficult exam for ROM. He strongly resists pronation and very painful Walks with crutches- able to bear weight on right ankle.   Assessment/Plan:   Left ankle pain S:  4 years ago rolled ankle when moving to [place] - was in dark in garage- December 2015- walked on it for a few days and was getting worse- spent new years Eve in ER- bad sprain at that time. Initially thought possible fracture but that came back negative he reports- x-ray does show report avulsion fracture though. He thought this was left ankle so thought issue today was pre-existing but we reviewed chart and prior injury was right ankle.   Was walking down  stairs on his normal route- walking down stairs and then put left foot out onto grass and immediately "folded over". Felt like possible pop/tear. He worked for 6 more hours walking/moving- seemed to do ok when moving but when slowed down- drove home 40 minutes and when got out- noted stiffness/pain/swelling. Called his kids for help. They got him some crutches and he was unable to walk. He couldn't find his old walking boot but had his daughters Cam walker boot and it is too short for his foot but does help him feel supported. A lot of pain even in boot touching down with foot.  A/P: Left ankle pain- at minimum this is a grade II sprain- will get further information from Dr. Paulla Fore. Unable to bear weight so will continue on crutches.  From avs  "I do not see an obvious fracture but the radiologist and Dr. Paulla Fore have better eyes than me  Due to your severity of pain- I think you need to see Dr. Paulla Fore - lets get you in this afternoon- go ahead and schedule at desk before you leave.   Continue on your crutches until then- they can get you in a better ankle support this afternoon"  He asks if this is workers comp case- I told him he should discuss with work. I cant imagine him being able to work within next 2 weeks- wrote out through December 4th tentatively.   For work schedule: Was supposed to work  today.  Off fri-Sunday.  Then was working [employer] of next week Off week of thanksgiving.   Asked him to send FMLA paperwork to me- workman's comp I can try to fill out if needed in conjunction with Dr. Paulla Fore as well if needed  Lab/Order associations: Left ankle pain, unspecified chronicity - Plan: DG Ankle Complete Left, Ambulatory referral to Sports Medicine  Time Stamp The duration of face-to-face time during this visit was greater than 25 minutes. Greater than 50% of this time was spent in counseling, explanation of diagnosis, planning of further management, and/or coordination of care  including reviewing prior injury report per patient request and discussing fact this was not preexisting condition, addressing his concerns about work and possible FMLA or workers comp, counseling on management and need for seeing sports medicine, reviewing x-ray films from todays visit.    Return precautions advised.  Garret Reddish, MD

## 2018-03-29 NOTE — Procedures (Signed)
LIMITED MSK ULTRASOUND OF Left ankle Images were obtained and interpreted by myself, Teresa Coombs, DO  Images have been saved and stored to PACS system. Images obtained on: GE S7 Ultrasound machine  FINDINGS:   Lateral ankle with marked subcutaneous edema and generalized synovitis.  There is no significant cortical disruption of the tibia or fibula over the anterior, middle or posterior aspect of the bone.  There is a small possible avulsion of the inferior lateral portion of the fibula but this is unclear.  No disruption of ligamentous structures appreciated.  IMPRESSION:  1. Lateral Ankle sprain with marked soft tissue swelling. No obvious ligamentous injury and no

## 2018-03-29 NOTE — Patient Instructions (Addendum)
Health Maintenance Due  Topic Date Due  . INFLUENZA VACCINE - need to do this when you are better 12/14/2017   I do not see an obvious fracture but the radiologist and Dr. Paulla Fore have better eyes than me  Due to your severity of pain- I think you need to see Dr. Paulla Fore - lets get you in this afternoon- go ahead and schedule at desk before you leave.   Continue on your crutches until then- they can get you in a better ankle support this afternoon

## 2018-03-31 ENCOUNTER — Encounter: Payer: Self-pay | Admitting: Sports Medicine

## 2018-03-31 DIAGNOSIS — M25572 Pain in left ankle and joints of left foot: Secondary | ICD-10-CM | POA: Insufficient documentation

## 2018-04-09 ENCOUNTER — Telehealth: Payer: Self-pay

## 2018-04-09 DIAGNOSIS — Z0279 Encounter for issue of other medical certificate: Secondary | ICD-10-CM

## 2018-04-09 NOTE — Telephone Encounter (Signed)
Forms have been faxed. Original is at the front desk for pt to pick up if he would like.

## 2018-04-09 NOTE — Telephone Encounter (Signed)
Called pt and advised. No further action required at this time.

## 2018-04-09 NOTE — Telephone Encounter (Signed)
Forms to Dr. Paulla Fore to review.

## 2018-04-09 NOTE — Telephone Encounter (Signed)
FMLA forms received 04/02/2018, due 04/13/2018. Fax forms to [phone].

## 2018-04-16 ENCOUNTER — Ambulatory Visit: Payer: Federal, State, Local not specified - PPO | Admitting: Sports Medicine

## 2018-04-16 ENCOUNTER — Encounter: Payer: Self-pay | Admitting: Sports Medicine

## 2018-04-16 VITALS — BP 116/84 | HR 99 | Ht 69.0 in | Wt 193.8 lb

## 2018-04-16 DIAGNOSIS — M25572 Pain in left ankle and joints of left foot: Secondary | ICD-10-CM | POA: Diagnosis not present

## 2018-04-16 DIAGNOSIS — S93402A Sprain of unspecified ligament of left ankle, initial encounter: Secondary | ICD-10-CM | POA: Diagnosis not present

## 2018-04-16 NOTE — Progress Notes (Signed)
Brandon Clayton, [facility]  Anterio Scheel Clayton - 42 y.o. male MRN [id number]  Date of birth: Oct 04, 1975  Visit Date:   PCP: Marin Olp, MD   Referred by: Marin Olp, MD   SUBJECTIVE:  Brandon Clayton is here for f/u L ankle sprain (Taking Tylenol prn, wearing boot but it was causing bruising on the back of the ankle, he has now d/c as of last Tuesday. Overall the ankle is improving.)   HPI: Patient is here for a almost 3-week follow-up after his initial injury.  He reports the pain is only being mild with mild weakness as well and difficulty with uneven terrain still going left to right.  He was able to discontinue the boot due to a small amount of skin breakdown on the posterior aspect of his head good results with this but continues to have significant limitations in his day-to-day activities.  Mild radiation into the left thigh.  He has been using Tylenol as needed and has been resting and elevating as well as on ABC exercises.  At this time he does not feel ready to return to work and I do agree.  REVIEW OF SYSTEMS: Denies night time disturbances. Denies fevers, chills, or night sweats. Denies unexplained weight loss. Denies personal history of cancer. Denies changes in bowel or bladder habits. Denies recent unreported falls. Denies new or worsening dyspnea or wheezing. Denies headaches or dizziness.  Denies numbness, tingling or weakness  In the extremities.  Denies dizziness or presyncopal episodes Denies lower extremity edema    HISTORY:  Prior history reviewed and updated per electronic medical record.  Social History  Occupational History  . 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 Smoking status: Never Smoker  . Smokeless tobacco: Never Used  Substance and Sexual Activity  . Alcohol use: No  . Drug use: No  . Sexual activity: Yes     Social History  Social History Narrative   Married.  6 people in home including his 2 sons (oldest 50 and youngest 38 in 2019) and 2 daughters.       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 No specialty comments available.  OBJECTIVE:  VS:  HT:5\' 9"  (175.3 cm)   WT:193 lb 12.8 oz (87.9 kg)  BMI:28.61    BP:116/84  HR:99bpm  TEMP: ( )  RESP:97 %   PHYSICAL EXAM: CONSTITUTIONAL: Well-developed, Well-nourished and In no acute distress PSYCHIATRIC : Alert & appropriately interactive. and Not depressed or anxious appearing. RESPIRATORY : No increased work of breathing and Trachea Midline EYES : Pupils are equal., EOM intact without nystagmus. and No scleral icterus.  VASCULAR EXAM : Warm and well perfused NEURO: unremarkable  MSK Exam:  Left ankle  Well aligned, no significant deformity. No significant skin change over the ankle he does have slight darkening over the posterior calf that has improved he reports this was slightly broken down prior to discontinuing the boot. TTP over the anterior lateral ankle especially anterior aspect distal fibula. Moderate swelling Mild generalized synovitis Normal inversion, eversion, dorsiflexion and plantar flexion  strength although slightly limited range passively with eversion.  Pain with terminal inversion localized to the anterior lateral ankle Ligamentously stable to Anterior drawer testing and kleiger testing. Normal cotton test with only minimal symptoms over the anterior syndesmosis with direct palpation.     ASSESSMENT  1. Sprain of left ankle, unspecified ligament, initial encounter   2. Acute left ankle pain     PLAN:  Pertinent additional documentation may be included in corresponding procedure notes, imaging studies,  problem based documentation and patient instructions.  Procedures:  None  Medications:  No orders of the defined types were placed in this encounter. Discussion/Instructions: No problem-specific Assessment & Plan notes found for this encounter. He has significantly better but continues to have a pain over the anterior lateral ankle consistent with anterior ankle sprain.  Ligamentously does seem to be stable however and I anticipate a full return and hopefully 2 more weeks but it may take an additional 4.  We will plan to reevaluate him in 2 weeks and at this time working [place] therapeutic exercise.  ASO provided today to be worn while ambulating. Brace/supportive device provided per AVS. Lace up ASO. Work / Youth worker note with recommendations provided.  Out of work for 2 additional weeks.  We will plan to at that time Discussed red flag symptoms that warrant earlier emergent evaluation and patient voices understanding. Activity modifications and the importance of avoiding exacerbating activities (limiting pain to no more than a 4 / 10 during or following activity) recommended and discussed.  Follow-up:  Return in about 2 weeks (around 04/30/2018).  If any lack of improvement: consider referral to Physical Therapy          Gerda Diss, [place] Physician

## 2018-04-16 NOTE — Progress Notes (Signed)
PROCEDURE NOTE: THERAPEUTIC EXERCISES (97110) 15 minutes spent for Therapeutic exercises as below and as referenced in the AVS.  This included exercises focusing on stretching, strengthening, with significant focus on eccentric aspects.   Proper technique shown and discussed handout in great detail with ATC.  All questions were discussed and answered.   Long term goals include an improvement in range of motion, strength, endurance as well as avoiding reinjury. Frequency of visits is one time as determined during today's  office visit. Frequency of exercises to be performed is as per handout.  EXERCISES REVIEWED: 4 way ANKLE exercises Proprioception

## 2018-04-16 NOTE — Patient Instructions (Addendum)
Please perform the exercise program that we have prepared for you and gone over in detail on a daily basis.  In addition to the handout you were provided you can access your program through: [email or web address]   Your unique program code is:  [email or web address]

## 2018-04-30 ENCOUNTER — Ambulatory Visit: Payer: Federal, State, Local not specified - PPO | Admitting: Sports Medicine

## 2018-04-30 ENCOUNTER — Encounter: Payer: Self-pay | Admitting: Sports Medicine

## 2018-04-30 VITALS — BP 124/86 | HR 87 | Ht 69.0 in | Wt 195.6 lb

## 2018-04-30 DIAGNOSIS — S93402A Sprain of unspecified ligament of left ankle, initial encounter: Secondary | ICD-10-CM | POA: Diagnosis not present

## 2018-04-30 DIAGNOSIS — M25572 Pain in left ankle and joints of left foot: Secondary | ICD-10-CM | POA: Diagnosis not present

## 2018-04-30 NOTE — Progress Notes (Signed)
Brandon Clayton. Brandon Clayton, Brandon Clayton  Brandon Clayton - 42 y.o. male MRN [id number]  Date of birth: Dec 26, 1975  Visit Date:   PCP: Marin Olp, MD   Referred by: Marin Olp, MD   SUBJECTIVE:  Chief Complaint  Patient presents with  . Follow-up    L ankle sprain    HPI: Patient is here for left ankle sprain.  Reports continues to have moderate intermittent pain rated at a 6 out of 10 twist slightly.  [place] instability is returning but he still feel unsure about his foot.  Is wearing ASO with his boots and this good improved symptoms.  Has been performing home therapeutic exercises with four-way ankle proprioception 5 times a week.  Occasionally takes Tylenol.  Rest and elevation have been helpful.  Continues to have a small amount of swelling  REVIEW OF SYSTEMS: Denies fevers, chills, recent weight gain or weight loss.  No night sweats. No significant nighttime awakenings due to this issue.    HISTORY:  Prior history reviewed and updated per electronic medical record.  Social History   Occupational History  . 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 Smoking status: Never Smoker  . Smokeless tobacco: Never Used  Substance and Sexual Activity  . Alcohol use: No  . Drug use: No  . Sexual activity: Yes   Social History   Social History Narrative   Married.  6 people in home including his 2 sons (oldest 54 and youngest 19 in 2019) and 2 daughters.       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 No specialty comments available.  OBJECTIVE:  VS:  HT:5\' 9"  (175.3 cm)   WT:195 lb 9.6 oz (88.7 kg)  BMI:28.87    BP:124/86  HR:87bpm  TEMP: ( )  RESP:96 %     PHYSICAL EXAM: Adult male in no acute distress alert and appropriate.  Left ankle is overall well aligned without formulae he does have a small amount of swelling along anterior lateral just distal to the lateral malleolus.  Most focally tender over the far lateral aspect of the joint.  No significant symptoms over peroneal tendons or posterior tibialis tendon.  No over the anterior syndesmosis.  Ankle drawer testing does cause minimal pain is overall good stability however.  Pain with talar tilting as well as kleiger testing.  No pain with cotton testing.  Improved proprioception with strength that is 4+/5 with inversion and eversion with normal strength and dorsiflexion.    ASSESSMENT  1. Acute left ankle pain   2. Sprain of left ankle, unspecified ligament, initial encounter     PLAN:  Pertinent additional documentation may be included in corresponding procedure notes, imaging studies, problem based documentation and patient instructions.  Procedures:  None  Medications:  No orders of the defined types were placed in this encounter.  Discussion/Instructions: No problem-specific Assessment & Plan notes found for this encounter. THERAPEUTIC EXERCISE: Discussed the foundation of treatment for this condition is physical therapy and/or daily (5-6 days/week) therapeutic exercises, focusing on core strengthening, coordination, neuromuscular control/reeducation.  Continue previously prescribed  home exercise program.  He is doing better and continues to show good steady improvement.  He is not quite ready to go back to work and I do agree with this given the demands of his job.  We will plan to keep him out of work for an additional 2 weeks and reevaluate him 1 additional time.  We did discuss that he will likely experience some pain and swelling especially after being on his feet for prolonged periods and this can happen for several months down the road.  He should continue with his home exercises.   Bracing with the ASO but I anticipate at time of his next evaluation he should be able to return to work without restrictions, to be able to return to normal workday. If any lack of improvement: consider referral to Physical Therapy   Return in about 2 weeks (around 05/14/2018).          Gerda Diss, [facility] [place] [facility] Physician

## 2018-05-14 ENCOUNTER — Ambulatory Visit: Payer: Federal, State, Local not specified - PPO | Admitting: Sports Medicine

## 2018-05-14 ENCOUNTER — Encounter: Payer: Self-pay | Admitting: Sports Medicine

## 2018-05-14 VITALS — BP 120/82 | HR 89 | Ht 69.0 in | Wt 197.2 lb

## 2018-05-14 DIAGNOSIS — M25571 Pain in right ankle and joints of right foot: Secondary | ICD-10-CM | POA: Diagnosis not present

## 2018-05-14 DIAGNOSIS — M25871 Other specified joint disorders, right ankle and foot: Secondary | ICD-10-CM

## 2018-05-14 DIAGNOSIS — S93402A Sprain of unspecified ligament of left ankle, initial encounter: Secondary | ICD-10-CM | POA: Diagnosis not present

## 2018-05-14 DIAGNOSIS — M25572 Pain in left ankle and joints of left foot: Secondary | ICD-10-CM | POA: Diagnosis not present

## 2018-05-14 NOTE — Progress Notes (Signed)
Brandon Clayton. , [facility]  Brandon Clayton - 42 y.o. male MRN [id number]  Date of birth: 1975-12-23  Visit Date:05/14/2018   PCP: Brandon Olp, MD   Referred by: Brandon Olp, MD   SUBJECTIVE:  Chief Complaint  Patient presents with  . f/u L ankle    Last XR 03/29/18. Tylenol prn. ASO ankle brace. HEP 5 days weekly.     HPI: Patient is here for reevaluation of left ankle pain.  He continues have some snapping in the mornings but symptoms are markedly improved.  There is a small amount of pressure along the lateral aspect of the ankle with activity but in the lace up ankle brace is helpful.  He is continue to do his home exercise program.  He does feel as though he is able to return to work at this time.    He has had some right ankle issues in the past as well and feels more stable using the lace up ASO for the left would like to try one for the right.  REVIEW OF SYSTEMS: 12 point review of systems is negative.  HISTORY:  Prior history reviewed and updated per electronic medical record.  Social History   Occupational History  . 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 Smoking status: Never Smoker  . Smokeless tobacco: Never Used  Substance and Sexual Activity  . Alcohol use: No  . Drug use: No  . Sexual activity: Yes   Social History   Social History Narrative   Married.  6 people in home including his 2 sons (oldest 18 and youngest 54 in 2019) and 2 daughters.       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  No acute distress.  Alert appropriate. Left ankle is significantly improved with less swelling but still  a small amount.  Slight pain over the anterior lateral ankle bilaterally.  He has good dorsiflexion, plantarflexion and inversion/eversion strength and range of motion although limited on the left compared to the right.  Ankle drawer testing still causes a mild amount of pain but no significant laxity.   ASSESSMENT  1. Sprain of left ankle, unspecified ligament, initial encounter   2. Acute left ankle pain   3. Arthralgia of right ankle   4. Ankle impingement syndrome, right     PLAN:  Pertinent additional documentation may be included in corresponding procedure notes, imaging studies, problem based documentation and patient instructions.  Procedures:  None  Medications:  No orders of the defined types were placed in this encounter.  Discussion/Instructions: No problem-specific Assessment & Plan notes found for this encounter.  He is doing well overall.  He will return to work without restrictions as of tomorrow.  If he has any worsening features or continues to have pain he will follow-up otherwise he understands this is a 3 to 16-month healing process.  Continue with home therapeutic exercises.  Work excuse note provided today.  Right ankle ASO provided today.  Return if symptoms worsen or fail to improve.  Brandon Clayton, [facility] Physician

## 2018-06-16 ENCOUNTER — Other Ambulatory Visit: Payer: Self-pay | Admitting: Sports Medicine

## 2018-06-19 ENCOUNTER — Other Ambulatory Visit: Payer: Self-pay | Admitting: Sports Medicine

## 2018-06-21 ENCOUNTER — Other Ambulatory Visit: Payer: Self-pay | Admitting: Sports Medicine

## 2018-06-23 ENCOUNTER — Encounter: Payer: Self-pay | Admitting: Sports Medicine

## 2018-07-02 ENCOUNTER — Encounter: Payer: Self-pay | Admitting: Family Medicine

## 2018-07-02 ENCOUNTER — Ambulatory Visit: Payer: Federal, State, Local not specified - PPO | Admitting: Family Medicine

## 2018-07-02 VITALS — BP 108/80 | HR 72 | Temp 98.9°F | Ht 69.0 in | Wt 191.6 lb

## 2018-07-02 DIAGNOSIS — R202 Paresthesia of skin: Secondary | ICD-10-CM | POA: Diagnosis not present

## 2018-07-02 DIAGNOSIS — M67911 Unspecified disorder of synovium and tendon, right shoulder: Secondary | ICD-10-CM

## 2018-07-02 NOTE — Assessment & Plan Note (Signed)
Right rotator cuff tendinitis also s:  Still with tingling in the right arm intermittently particularly with extended reach of his arm.  Work accommodations from last year have been helpful in preventing worsening symptoms. Pain after lipoma removal is better but still tingling in hand as well.   Patient also has a history of right rotator cuff issues.  He has tried the exercises given by Dr. Paulla Fore but this seems to exacerbate pain. A/P: Patient's right hand paresthesias and right rotator cuff tendinitis appear to be stable.  Work accommodations have been helpful-I wrote a letter to extend these.  Patient is trying to avoid medication-very sparingly uses Tylenol-we will actually go ahead and remove this from his list.  We discussed that he has worsening symptoms-could refer back to Dr. Paulla Fore of sports medicine

## 2018-07-02 NOTE — Patient Instructions (Addendum)
No changes today. Sorry the exercises have not been more helpful for the shoulder. Lets reevaluate once a year  Schedule a physical in 1 year and we can update your letter at that time. Can also update bloodwork then

## 2018-07-02 NOTE — Progress Notes (Signed)
  Phone [phone]   Subjective:  Brandon Clayton is a 43 y.o. year old very pleasant male patient who presents for/with See problem oriented charting ROS-continued right shoulder pain.  No chest pain or shortness of breath.  Right hand paresthesias.  Symptoms seem to worsen with abduction and extension of shoulder  Past Medical History-  Patient Active Problem List   Diagnosis Date Noted  . Paresthesias 11/28/2017    Priority: Medium  . Lipoma of right upper extremity 07/18/2017    Priority: Low  . Abscess 07/29/2016    Priority: Low  . Acute left ankle pain 03/31/2018  . Osteoarthritis of spine with radiculopathy, cervical region 12/04/2017   Medications- reviewed and updated.  No regular medications    Objective:  BP 108/80 (BP Location: Left Arm, Patient Position: Sitting, Cuff Size: Large)   Pulse 72   Temp 98.9 F (37.2 C) (Oral)   Ht 5\' 9"  (1.753 m)   Wt 191 lb 9.6 oz (86.9 kg)   SpO2 97%   BMI 28.29 kg/m  Gen: NAD, resting comfortably CV: RRR no murmurs rubs or gallops Lungs: CTAB no crackles, wheeze, rhonchi Ext: no edema Skin: warm, dry Right shoulder- pain with empty can, Neer and Hawkins test.  No external abnormalities or pain with palpation over right shoulder. - Intact to monofilament over right    Assessment and Plan    Paresthesias Right rotator cuff tendinitis also-likely more tendinopathy at this point s:  Still with tingling in the right arm intermittently particularly with extended reach of his arm.  Work accommodations from last year have been helpful in preventing worsening symptoms. Pain after lipoma removal is better but still tingling in hand as well.   Patient also has a history of right rotator cuff issues.  He has tried the exercises given by Dr. Paulla Fore but this seems to exacerbate pain. A/P: Patient's right hand paresthesias and right rotator cuff tendinitis appear to be stable.  Work accommodations have been helpful-I wrote a  letter to extend these.  Patient is trying to avoid medication-very sparingly uses Tylenol-we will actually go ahead and remove this from his list.  We discussed that he has worsening symptoms-could refer back to Dr. Paulla Fore of sports medicine  Also note- work restrictions related to ankle sprain or per Dr. Avon Gully is improving   Time Stamp The duration of face-to-face time during this visit was greater than 15 minutes. Greater than 50% of this time was spent in counseling, explanation of diagnosis, planning of further management, and/or coordination of care including discussing ways to prevent further worsening of symptoms, discussing role of an annual physical and doing that next year, discussing work accommodations..    Return precautions advised.  Garret Reddish, MD

## 2018-07-23 ENCOUNTER — Telehealth: Payer: Self-pay | Admitting: Family Medicine

## 2018-07-23 NOTE — Telephone Encounter (Signed)
See note  Copied from [place] [id number]. Topic: General - Inquiry >> Jul 23, 2018 11:07 AM Virl Axe D wrote: Reason for CRM: Pt stated that he had an OV with Dr. Yong Channel on 07/02/18 and was written/given a letter for work regarding his Paresthesias. Pt stated he misplaced the letter and is it not showing up on his MyChart like it normally does. He needs to have this letter by tomorrow due to him having work inspections. He would like to know if the letter he was provided can be uploaded to [place] today. Please advise. EH#[phone]

## 2018-07-23 NOTE — Telephone Encounter (Signed)
Letter uploaded to [facility]. Pt notified of update.

## 2018-09-16 DIAGNOSIS — M545 Low back pain: Secondary | ICD-10-CM | POA: Diagnosis not present

## 2018-09-16 DIAGNOSIS — M62830 Muscle spasm of back: Secondary | ICD-10-CM | POA: Diagnosis not present

## 2019-01-23 ENCOUNTER — Encounter: Payer: Self-pay | Admitting: Family Medicine

## 2019-01-23 NOTE — Telephone Encounter (Signed)
Patient is scheduled for the next available (also due to his demanding schedule as he stated) for February 18, 2019.  Patient stated that he wants Dr. Yong Channel to respond to the [facility] message himself before then to address his work restrictions and have them changed.   He said that if he does not hear anything he will send a message every day until October 5th until resolved.   Please advise.

## 2019-01-29 DIAGNOSIS — M542 Cervicalgia: Secondary | ICD-10-CM | POA: Diagnosis not present

## 2019-01-29 DIAGNOSIS — G8929 Other chronic pain: Secondary | ICD-10-CM | POA: Diagnosis not present

## 2019-01-29 DIAGNOSIS — M25562 Pain in left knee: Secondary | ICD-10-CM | POA: Diagnosis not present

## 2019-01-29 DIAGNOSIS — M25572 Pain in left ankle and joints of left foot: Secondary | ICD-10-CM | POA: Diagnosis not present

## 2019-02-18 ENCOUNTER — Other Ambulatory Visit: Payer: Self-pay

## 2019-02-18 ENCOUNTER — Ambulatory Visit (INDEPENDENT_AMBULATORY_CARE_PROVIDER_SITE_OTHER): Payer: Federal, State, Local not specified - PPO | Admitting: Family Medicine

## 2019-02-18 ENCOUNTER — Encounter: Payer: Self-pay | Admitting: Family Medicine

## 2019-02-18 VITALS — BP 124/70 | HR 80 | Temp 98.1°F | Ht 69.0 in | Wt 192.8 lb

## 2019-02-18 DIAGNOSIS — M25562 Pain in left knee: Secondary | ICD-10-CM

## 2019-02-18 DIAGNOSIS — M25572 Pain in left ankle and joints of left foot: Secondary | ICD-10-CM | POA: Diagnosis not present

## 2019-02-18 DIAGNOSIS — M4722 Other spondylosis with radiculopathy, cervical region: Secondary | ICD-10-CM | POA: Diagnosis not present

## 2019-02-18 DIAGNOSIS — R202 Paresthesia of skin: Secondary | ICD-10-CM

## 2019-02-18 NOTE — Patient Instructions (Addendum)
Health Maintenance Due  Topic Date Due  . INFLUENZA VACCINE - declined 12/15/2018   I am glad you are doing better with change in your route recently- let's try to continue these restrictions to keep you in full health  Schedule physical for 4-6 months so we can update bloodwork/PSA.
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
## 2019-06-13 DIAGNOSIS — K080 Exfoliation of teeth due to systemic causes: Secondary | ICD-10-CM | POA: Diagnosis not present

## 2019-11-29 ENCOUNTER — Other Ambulatory Visit: Payer: Self-pay

## 2019-11-29 ENCOUNTER — Encounter: Payer: Self-pay | Admitting: Family Medicine

## 2019-11-29 ENCOUNTER — Ambulatory Visit (INDEPENDENT_AMBULATORY_CARE_PROVIDER_SITE_OTHER): Payer: Federal, State, Local not specified - PPO | Admitting: Family Medicine

## 2019-11-29 VITALS — BP 120/68 | HR 71 | Temp 98.6°F | Ht 69.0 in | Wt 190.2 lb

## 2019-11-29 DIAGNOSIS — Z13220 Encounter for screening for lipoid disorders: Secondary | ICD-10-CM

## 2019-11-29 DIAGNOSIS — R7989 Other specified abnormal findings of blood chemistry: Secondary | ICD-10-CM

## 2019-11-29 DIAGNOSIS — Z9189 Other specified personal risk factors, not elsewhere classified: Secondary | ICD-10-CM

## 2019-11-29 DIAGNOSIS — Z0000 Encounter for general adult medical examination without abnormal findings: Secondary | ICD-10-CM

## 2019-11-29 DIAGNOSIS — R351 Nocturia: Secondary | ICD-10-CM

## 2019-11-29 DIAGNOSIS — Z1159 Encounter for screening for other viral diseases: Secondary | ICD-10-CM | POA: Diagnosis not present

## 2019-11-29 LAB — POC URINALSYSI DIPSTICK (AUTOMATED)
Bilirubin, UA: NEGATIVE
Blood, UA: NEGATIVE
Glucose, UA: NEGATIVE
Ketones, UA: NEGATIVE
Leukocytes, UA: NEGATIVE
Nitrite, UA: NEGATIVE
Protein, UA: NEGATIVE
Spec Grav, UA: 1.025 (ref 1.010–1.025)
Urobilinogen, UA: 0.2 E.U./dL
pH, UA: 6 (ref 5.0–8.0)

## 2019-11-29 LAB — CBC WITH DIFFERENTIAL/PLATELET
Basophils Absolute: 0 10*3/uL (ref 0.0–0.1)
Basophils Relative: 0.5 % (ref 0.0–3.0)
Eosinophils Absolute: 0.2 10*3/uL (ref 0.0–0.7)
Eosinophils Relative: 2.5 % (ref 0.0–5.0)
HCT: 43.7 % (ref 39.0–52.0)
Hemoglobin: 15.1 g/dL (ref 13.0–17.0)
Lymphocytes Relative: 31.5 % (ref 12.0–46.0)
Lymphs Abs: 2.3 10*3/uL (ref 0.7–4.0)
MCHC: 34.5 g/dL (ref 30.0–36.0)
MCV: 93.5 fl (ref 78.0–100.0)
Monocytes Absolute: 0.7 10*3/uL (ref 0.1–1.0)
Monocytes Relative: 9.1 % (ref 3.0–12.0)
Neutro Abs: 4 10*3/uL (ref 1.4–7.7)
Neutrophils Relative %: 56.4 % (ref 43.0–77.0)
Platelets: 309 10*3/uL (ref 150.0–400.0)
RBC: 4.67 Mil/uL (ref 4.22–5.81)
RDW: 12.4 % (ref 11.5–15.5)
WBC: 7.2 10*3/uL (ref 4.0–10.5)

## 2019-11-29 LAB — LIPID PANEL
Cholesterol: 189 mg/dL (ref 0–200)
HDL: 51.9 mg/dL (ref >39.00)
LDL Cholesterol: 109 mg/dL — ABNORMAL HIGH (ref 0–99)
NonHDL: 137.04
Total CHOL/HDL Ratio: 4
Triglycerides: 142 mg/dL (ref 0.0–149.0)
VLDL: 28.4 mg/dL (ref 0.0–40.0)

## 2019-11-29 LAB — COMPREHENSIVE METABOLIC PANEL
ALT: 72 U/L — ABNORMAL HIGH (ref 0–53)
AST: 27 U/L (ref 0–37)
Albumin: 4.5 g/dL (ref 3.5–5.2)
Alkaline Phosphatase: 79 U/L (ref 39–117)
BUN: 13 mg/dL (ref 6–23)
CO2: 28 mEq/L (ref 19–32)
Calcium: 9.6 mg/dL (ref 8.4–10.5)
Chloride: 102 mEq/L (ref 96–112)
Creatinine, Ser: 0.91 mg/dL (ref 0.40–1.50)
GFR: 91.03 mL/min (ref >60.00)
Glucose, Bld: 86 mg/dL (ref 70–99)
Potassium: 4.2 mEq/L (ref 3.5–5.1)
Sodium: 135 mEq/L (ref 135–145)
Total Bilirubin: 0.9 mg/dL (ref 0.2–1.2)
Total Protein: 7.2 g/dL (ref 6.0–8.3)

## 2019-11-29 LAB — PSA: PSA: 1.09 ng/mL (ref 0.10–4.00)

## 2019-11-29 NOTE — Progress Notes (Signed)
  Phone [phone] In person visit   Subjective:   Brandon Clayton is a 44 y.o. year old very pleasant male patient who presents for/with See problem oriented charting Chief Complaint  Patient presents with  . Follow-up    labs and prostate check     This visit occurred during the SARS-CoV-2 public health emergency.  Safety protocols were in place, including screening questions prior to the visit, additional usage of staff PPE, and extensive cleaning of exam room while observing appropriate contact time as indicated for disinfecting solutions.   Past Medical History-  Patient Active Problem List   Diagnosis Date Noted  . Osteoarthritis of spine with radiculopathy, cervical region 12/04/2017    Priority: Medium  . Paresthesias 11/28/2017    Priority: Medium  . Acute left ankle pain 03/31/2018    Priority: Low  . Lipoma of right upper extremity 07/18/2017    Priority: Low  . Abscess 07/29/2016    Priority: Low    Medications- reviewed and updated No current outpatient medications on file.   No current facility-administered medications for this visit.     Objective:  BP 120/68   Pulse 71   Temp 98.6 F (37 C) (Temporal)   Ht 5\' 9"  (1.753 m)   Wt 190 lb 3.2 oz (86.3 kg)   SpO2 97%   BMI 28.09 kg/m  Gen: NAD, resting comfortably CV: RRR no murmurs rubs or gallops Lungs: CTAB no crackles, wheeze, rhonchi Abdomen: soft/nontender/nondistended No rebound or guarding.  Ext: no edema Skin: warm, dry Rectal: normal tone, diffusely enlarged prostate, no masses or tenderness     Assessment and Plan   # NOcturia S:strong family history of prostate cancer.  Father with prostate cancer at age 2, grandfather on dad's side also had prostate cancer. Some increased frequency. Nocturia 4x a night- increased in last year or two.   No prior PSAs on file.  No results found for: PSA1, PSA A/P: Nocturia and some urinary frequency and 44 year old male with family  history of prostate cancer-we'll get urinalysis and baseline PSA.  Has physical in October and we may repeat at that time.  Does have some mild BPH on exam which could be the cause -We essentially completed physical blood work today so we may or may not repeat at that time  #Elevated ALT-mild elevation in 2019 at forty-eight-prior that has been up to seventy-eight.  We will update LFTs with blood work today  #Screening proper lipidemia-no recent lipids on file-update since we're doing blood work today nonfasting  Recommended follow up: Return in about 3 months (around 02/29/2020) for next already scheduled visit. Future Appointments  Date Time Provider [place]  02/24/2020 11:20 AM Marin Olp, MD [facility]    Lab/Order associations:   ICD-10-CM   1. Nocturia  R35.1 PSA    POCT Urinalysis Dipstick (Automated)    CBC with Differential/Platelet  2. Elevated LFTs  R79.89 Comprehensive metabolic panel  3. Screening for hyperlipidemia  Z13.220 Lipid panel  4. Preventative health care  Z00.00 Hepatitis C antibody    PSA    POCT Urinalysis Dipstick (Automated)    Comprehensive metabolic panel    Lipid panel    CBC with Differential/Platelet  5. Encounter for hepatitis C virus screening test for high risk patient  Z11.59 Hepatitis C antibody   Z91.89    Return precautions advised.  Garret Reddish, MD

## 2019-11-29 NOTE — Patient Instructions (Addendum)
Health Maintenance Due  Topic Date Due  . Hepatitis C Screening - with labs Never done  . COVID-19 Vaccine (1) declined at this time  Never done   Please stop by lab before you go If you have mychart- we will send your results within 3 business days of [place] receiving them.  If you do not have mychart- we will call you about results within 5 business days of [place] receiving them.   Recommended follow up: Return in about 3 months (around 02/29/2020) for next already scheduled visit.

## 2019-11-29 NOTE — Addendum Note (Signed)
Addended by: Betti Cruz on: 11/29/2019 01:35 PM   Modules accepted: Orders

## 2019-12-02 LAB — HEPATITIS C ANTIBODY
Hepatitis C Ab: NONREACTIVE
SIGNAL TO CUT-OFF: 0.04 (ref <1.00)

## 2020-02-16 DIAGNOSIS — Z20822 Contact with and (suspected) exposure to covid-19: Secondary | ICD-10-CM | POA: Diagnosis not present

## 2020-02-22 ENCOUNTER — Encounter: Payer: Self-pay | Admitting: Family Medicine

## 2020-02-24 ENCOUNTER — Encounter: Payer: Federal, State, Local not specified - PPO | Admitting: Family Medicine

## 2020-05-15 ENCOUNTER — Ambulatory Visit
Admission: EM | Admit: 2020-05-15 | Discharge: 2020-05-15 | Disposition: A | Discharge status: Home / Self Care | Payer: Federal, State, Local not specified - PPO | Attending: Internal Medicine | Admitting: Internal Medicine

## 2020-05-15 ENCOUNTER — Ambulatory Visit (INDEPENDENT_AMBULATORY_CARE_PROVIDER_SITE_OTHER): Payer: Federal, State, Local not specified - PPO

## 2020-05-15 DIAGNOSIS — R0602 Shortness of breath: Secondary | ICD-10-CM | POA: Diagnosis not present

## 2020-05-15 DIAGNOSIS — J209 Acute bronchitis, unspecified: Secondary | ICD-10-CM | POA: Diagnosis not present

## 2020-05-15 DIAGNOSIS — R059 Cough, unspecified: Secondary | ICD-10-CM | POA: Diagnosis not present

## 2020-05-15 MED ORDER — PREDNISONE 50 MG PO TABS
50.0000 mg | ORAL_TABLET | Freq: Every day | ORAL | 0 refills | Status: DC
Start: 2020-05-15 — End: 2020-06-18

## 2020-05-15 MED ORDER — AEROCHAMBER PLUS FLO-VU MEDIUM MISC
1.0000 | Freq: Once | 0 refills | Status: AC
Start: 2020-05-15 — End: 2020-05-15

## 2020-05-15 MED ORDER — BENZONATATE 100 MG PO CAPS: 100.0000 mg | ORAL_CAPSULE | Freq: Three times a day (TID) | ORAL | 0 refills | Status: DC

## 2020-05-15 MED ORDER — ALBUTEROL SULFATE HFA 108 (90 BASE) MCG/ACT IN AERS
2.0000 | INHALATION_SPRAY | Freq: Four times a day (QID) | RESPIRATORY_TRACT | 2 refills | Status: DC | PRN
Start: 2020-05-15 — End: 2020-06-18

## 2020-05-15 NOTE — ED Triage Notes (Signed)
Pt present congestion in his chest with SOB. Pt state symptoms started last Saturday. Pt believes he has pneumonia.
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
## 2020-05-15 NOTE — Discharge Instructions (Signed)

## 2020-05-16 DIAGNOSIS — Q248 Other specified congenital malformations of heart: Secondary | ICD-10-CM

## 2020-05-16 HISTORY — DX: Other specified congenital malformations of heart: Q24.8

## 2020-05-17 DIAGNOSIS — R059 Cough, unspecified: Secondary | ICD-10-CM | POA: Diagnosis not present

## 2020-05-17 DIAGNOSIS — Z7185 Encounter for immunization safety counseling: Secondary | ICD-10-CM | POA: Diagnosis not present

## 2020-06-17 NOTE — Patient Instructions (Addendum)
Do some home checks on blood pressure- we want you to be <135/85 on average. Hair high today on bottom # at 92 and was high in urgent care as well.   Significant finger injury- would not be surprised if you lose the nail on that finger. Pain is not severe on bony portions so we opted not to x-ray today. I do think the swelling is likely irritating your nerves causing some tingling/pins/needles sensation- im hoping this will improve as swelling goes down over coming weeks. It is possible you could have some lingering numbness/tingling even after that with how bad you hit your finger.     [email or web address]">  DASH Eating Plan DASH stands for Dietary Approaches to Stop Hypertension. The DASH eating plan is a healthy eating plan that has been shown to:  Reduce high blood pressure (hypertension).  Reduce your risk for type 2 diabetes, heart disease, and stroke.  Help with weight loss. What are tips for following this plan? Reading food labels  Check food labels for the amount of salt (sodium) per serving. Choose foods with less than 5 percent of the Daily Value of sodium. Generally, foods with less than 300 milligrams (mg) of sodium per serving fit into this eating plan.  To find whole grains, look for the word "whole" as the first word in the ingredient list. Shopping  Buy products labeled as "low-sodium" or "no salt added."  Buy fresh foods. Avoid canned foods and pre-made or frozen meals. Cooking  Avoid adding salt when cooking. Use salt-free seasonings or herbs instead of table salt or sea salt. Check with your health care provider or pharmacist before using salt substitutes.  Do not fry foods. Cook foods using healthy methods such as baking, boiling, grilling, roasting, and broiling instead.  Cook with heart-healthy oils, such as olive, canola, avocado, soybean, or sunflower oil. Meal planning  Eat a balanced diet that includes: ? 4  or more servings of fruits and 4 or more servings of vegetables each day. Try to fill one-half of your plate with fruits and vegetables. ? 6-8 servings of whole grains each day. ? Less than 6 oz (170 g) of lean meat, poultry, or fish each day. A 3-oz (85-g) serving of meat is about the same size as a deck of cards. One egg equals 1 oz (28 g). ? 2-3 servings of low-fat dairy each day. One serving is 1 cup (237 mL). ? 1 serving of nuts, seeds, or beans 5 times each week. ? 2-3 servings of heart-healthy fats. Healthy fats called omega-3 fatty acids are found in foods such as walnuts, flaxseeds, fortified milks, and eggs. These fats are also found in cold-water fish, such as sardines, salmon, and mackerel.  Limit how much you eat of: ? Canned or prepackaged foods. ? Food that is high in trans fat, such as some fried foods. ? Food that is high in saturated fat, such as fatty meat. ? Desserts and other sweets, sugary drinks, and other foods with added sugar. ? Full-fat dairy products.  Do not salt foods before eating.  Do not eat more than 4 egg yolks a week.  Try to eat at least 2 vegetarian meals a week.  Eat more home-cooked food and less restaurant, buffet, and fast food.   Lifestyle  When eating at a restaurant, ask that your food be prepared with less salt or no salt, if possible.  If you drink alcohol: ? Limit how much you use to:  0-1 drink a  day for women who are not pregnant.  0-2 drinks a day for men. ? Be aware of how much alcohol is in your drink. In the [place] one drink equals one 12 oz bottle of beer (355 mL), one 5 oz glass of wine (148 mL), or one 1 oz glass of hard liquor (44 mL). General information  Avoid eating more than 2,300 mg of salt a day. If you have hypertension, you may need to reduce your sodium intake to 1,500 mg a day.  Work with your health care provider to maintain a healthy body weight or to lose weight. Ask what an ideal weight is for you.  Get at  least 30 minutes of exercise that causes your heart to beat faster (aerobic exercise) most days of the week. Activities may include walking, swimming, or biking.  Work with your health care provider or dietitian to adjust your eating plan to your individual calorie needs. What foods should I eat? Fruits All fresh, dried, or frozen fruit. Canned fruit in natural juice (without added sugar). Vegetables Fresh or frozen vegetables (raw, steamed, roasted, or grilled). Low-sodium or reduced-sodium tomato and vegetable juice. Low-sodium or reduced-sodium tomato sauce and tomato paste. Low-sodium or reduced-sodium canned vegetables. Grains Whole-grain or whole-wheat bread. Whole-grain or whole-wheat pasta. Brown rice. [place]. Bulgur. Whole-grain and low-sodium cereals. Pita bread. Low-fat, low-sodium crackers. Whole-wheat flour tortillas. Meats and other proteins Skinless chicken or [place]. Ground chicken or [place]. Pork with fat trimmed off. Fish and seafood. Egg whites. Dried beans, peas, or lentils. Unsalted nuts, nut butters, and seeds. Unsalted canned beans. Lean cuts of beef with fat trimmed off. Low-sodium, lean precooked or cured meat, such as sausages or meat loaves. Dairy Low-fat (1%) or fat-free (skim) milk. Reduced-fat, low-fat, or fat-free cheeses. Nonfat, low-sodium ricotta or cottage cheese. Low-fat or nonfat yogurt. Low-fat, low-sodium cheese. Fats and oils Soft margarine without trans fats. Vegetable oil. Reduced-fat, low-fat, or light mayonnaise and salad dressings (reduced-sodium). Canola, safflower, olive, avocado, soybean, and sunflower oils. Avocado. Seasonings and condiments Herbs. Spices. Seasoning mixes without salt. Other foods Unsalted popcorn and pretzels. Fat-free sweets. The items listed above may not be a complete list of foods and beverages you can eat. Contact a dietitian for more information. What foods should I avoid? Fruits Canned fruit in a light or heavy  syrup. Fried fruit. Fruit in cream or butter sauce. Vegetables Creamed or fried vegetables. Vegetables in a cheese sauce. Regular canned vegetables (not low-sodium or reduced-sodium). Regular canned tomato sauce and paste (not low-sodium or reduced-sodium). Regular tomato and vegetable juice (not low-sodium or reduced-sodium). Angie Fava. Olives. Grains Baked goods made with fat, such as croissants, muffins, or some breads. Dry pasta or rice meal packs. Meats and other proteins Fatty cuts of meat. Ribs. Fried meat. Berniece Salines. Bologna, salami, and other precooked or cured meats, such as sausages or meat loaves. Fat from the back of a pig (fatback). Bratwurst. Salted nuts and seeds. Canned beans with added salt. Canned or smoked fish. Whole eggs or egg yolks. Chicken or [place] with skin. Dairy Whole or 2% milk, cream, and half-and-half. Whole or full-fat cream cheese. Whole-fat or sweetened yogurt. Full-fat cheese. Nondairy creamers. Whipped toppings. Processed cheese and cheese spreads. Fats and oils Butter. Stick margarine. Lard. Shortening. Ghee. Bacon fat. Tropical oils, such as coconut, palm kernel, or palm oil. Seasonings and condiments Onion salt, garlic salt, seasoned salt, table salt, and sea salt. Worcestershire sauce. Tartar sauce. Barbecue sauce. Teriyaki sauce. Soy sauce, including reduced-sodium. Steak  sauce. Canned and packaged gravies. Fish sauce. Oyster sauce. Cocktail sauce. Store-bought horseradish. Ketchup. Mustard. Meat flavorings and tenderizers. Bouillon cubes. Hot sauces. Pre-made or packaged marinades. Pre-made or packaged taco seasonings. Relishes. Regular salad dressings. Other foods Salted popcorn and pretzels. The items listed above may not be a complete list of foods and beverages you should avoid. Contact a dietitian for more information. Where to find more information  National Heart, Lung, and Blood Institute: https://wilson-eaton.com/  American Heart Association:  www.heart.org  Academy of Nutrition and Dietetics: www.eatright.[place]: www.kidney.org Summary  The DASH eating plan is a healthy eating plan that has been shown to reduce high blood pressure (hypertension). It may also reduce your risk for type 2 diabetes, heart disease, and stroke.  When on the DASH eating plan, aim to eat more fresh fruits and vegetables, whole grains, lean proteins, low-fat dairy, and heart-healthy fats.  With the DASH eating plan, you should limit salt (sodium) intake to 2,300 mg a day. If you have hypertension, you may need to reduce your sodium intake to 1,500 mg a day.  Work with your health care provider or dietitian to adjust your eating plan to your individual calorie needs. This information is not intended to replace advice given to you by your health care provider. Make sure you discuss any questions you have with your health care provider. Document Revised: 04/05/2019 Document Reviewed: 04/05/2019 Elsevier Patient Education  2021 [employer].
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
## 2020-06-18 ENCOUNTER — Ambulatory Visit: Payer: Federal, State, Local not specified - PPO | Admitting: Family Medicine

## 2020-06-18 ENCOUNTER — Other Ambulatory Visit: Payer: Self-pay

## 2020-06-18 ENCOUNTER — Encounter: Payer: Self-pay | Admitting: Family Medicine

## 2020-06-18 VITALS — BP 132/92 | HR 89 | Temp 98.0°F | Ht 69.0 in | Wt 194.6 lb

## 2020-06-18 DIAGNOSIS — M4722 Other spondylosis with radiculopathy, cervical region: Secondary | ICD-10-CM

## 2020-06-18 DIAGNOSIS — M79644 Pain in right finger(s): Secondary | ICD-10-CM

## 2020-06-18 DIAGNOSIS — R202 Paresthesia of skin: Secondary | ICD-10-CM

## 2020-08-31 ENCOUNTER — Emergency Department: Payer: Federal, State, Local not specified - PPO

## 2020-08-31 ENCOUNTER — Emergency Department
Admission: EM | Admit: 2020-08-31 | Discharge: 2020-08-31 | Disposition: A | Discharge status: Home / Self Care | Payer: Federal, State, Local not specified - PPO | Attending: Emergency Medicine | Admitting: Emergency Medicine

## 2020-08-31 ENCOUNTER — Other Ambulatory Visit: Payer: Self-pay

## 2020-08-31 DIAGNOSIS — D499 Neoplasm of unspecified behavior of unspecified site: Secondary | ICD-10-CM | POA: Insufficient documentation

## 2020-08-31 DIAGNOSIS — J9859 Other diseases of mediastinum, not elsewhere classified: Secondary | ICD-10-CM | POA: Diagnosis not present

## 2020-08-31 DIAGNOSIS — K439 Ventral hernia without obstruction or gangrene: Secondary | ICD-10-CM | POA: Diagnosis not present

## 2020-08-31 DIAGNOSIS — M25512 Pain in left shoulder: Secondary | ICD-10-CM | POA: Diagnosis not present

## 2020-08-31 DIAGNOSIS — R079 Chest pain, unspecified: Secondary | ICD-10-CM | POA: Insufficient documentation

## 2020-08-31 DIAGNOSIS — M47819 Spondylosis without myelopathy or radiculopathy, site unspecified: Secondary | ICD-10-CM | POA: Diagnosis not present

## 2020-08-31 DIAGNOSIS — K432 Incisional hernia without obstruction or gangrene: Secondary | ICD-10-CM | POA: Diagnosis not present

## 2020-08-31 DIAGNOSIS — K429 Umbilical hernia without obstruction or gangrene: Secondary | ICD-10-CM | POA: Diagnosis not present

## 2020-08-31 DIAGNOSIS — R0789 Other chest pain: Secondary | ICD-10-CM | POA: Diagnosis not present

## 2020-08-31 DIAGNOSIS — K469 Unspecified abdominal hernia without obstruction or gangrene: Secondary | ICD-10-CM | POA: Diagnosis not present

## 2020-08-31 LAB — CBC
HCT: 43.4 % (ref 39.0–52.0)
Hemoglobin: 15.3 g/dL (ref 13.0–17.0)
MCH: 32.5 pg (ref 26.0–34.0)
MCHC: 35.3 g/dL (ref 30.0–36.0)
MCV: 92.1 fL (ref 80.0–100.0)
Platelets: 342 10*3/uL (ref 150–400)
RBC: 4.71 MIL/uL (ref 4.22–5.81)
RDW: 12 % (ref 11.5–15.5)
WBC: 9.8 10*3/uL (ref 4.0–10.5)
nRBC: 0 % (ref 0.0–0.2)

## 2020-08-31 LAB — TROPONIN I (HIGH SENSITIVITY)
Troponin I (High Sensitivity): 3 ng/L (ref <18)
Troponin I (High Sensitivity): 2 ng/L (ref <18)

## 2020-08-31 LAB — BASIC METABOLIC PANEL
Anion gap: 8 (ref 5–15)
BUN: 10 mg/dL (ref 6–20)
CO2: 24 mmol/L (ref 22–32)
Calcium: 9.2 mg/dL (ref 8.9–10.3)
Chloride: 109 mmol/L (ref 98–111)
Creatinine, Ser: 1.15 mg/dL (ref 0.61–1.24)
GFR, Estimated: >60 mL/min (ref >60)
Glucose, Bld: 83 mg/dL (ref 70–99)
Potassium: 4.3 mmol/L (ref 3.5–5.1)
Sodium: 141 mmol/L (ref 135–145)

## 2020-08-31 MED ORDER — HYDROCODONE-ACETAMINOPHEN 5-325 MG PO TABS
1.0000 | ORAL_TABLET | Freq: Once | ORAL | Status: AC
Start: 2020-08-31 — End: 2020-08-31
  Administered 2020-08-31: 1 via ORAL
  Filled 2020-08-31: qty 1

## 2020-08-31 MED ORDER — KETOROLAC TROMETHAMINE 30 MG/ML IJ SOLN
30.0000 mg | Freq: Once | INTRAMUSCULAR | Status: AC
  Administered 2020-08-31: 30 mg via INTRAVENOUS
  Filled 2020-08-31: qty 1

## 2020-08-31 MED ORDER — MORPHINE SULFATE (PF) 4 MG/ML IV SOLN
4.0000 mg | Freq: Once | INTRAVENOUS | Status: AC
  Administered 2020-08-31: 4 mg via INTRAVENOUS
  Filled 2020-08-31: qty 1

## 2020-08-31 MED ORDER — ACETAMINOPHEN 325 MG PO TABS
650.0000 mg | ORAL_TABLET | Freq: Once | ORAL | Status: AC
  Administered 2020-08-31: 650 mg via ORAL
  Filled 2020-08-31: qty 2

## 2020-08-31 MED ORDER — HYDROCODONE-ACETAMINOPHEN 5-325 MG PO TABS: 1.0000 | ORAL_TABLET | Freq: Four times a day (QID) | ORAL | 0 refills | Status: DC | PRN

## 2020-08-31 MED ORDER — IOHEXOL 350 MG/ML SOLN
100.0000 mL | Freq: Once | INTRAVENOUS | Status: AC | PRN
  Administered 2020-08-31: 100 mL via INTRAVENOUS
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
## 2020-08-31 NOTE — ED Notes (Signed)
Pt given pillow to assist with repositioning for comfort. Pt unable to find comfortable position to decrease pain in left shoulder blade.

## 2020-08-31 NOTE — ED Triage Notes (Addendum)
Pt states tonight around 1800 he started having left sided chest pain described as sharp, as well as shortness of breath. Pt denies any cardiac hx. Pt appears very uncomfortable in triage. Pt states he has large cyst in chest tht he is concerned could be causing the pain

## 2020-08-31 NOTE — ED Notes (Signed)
Pt reports throbbing pain in left shoulder blade that radiates to chest. Pt reports that he had something similar happen in the past when they discovered he had a cyst forming between two bones. Pt also reports having a cyst in his chest at present. Pt holding onto left shoulder with right arm for pain relief measures. Pt remains groaning in pain during assessment.

## 2020-09-02 NOTE — Patient Instructions (Addendum)
45 year old male with left-sided chest pain radiating to the back-extensive work-up in the emergency room was reassuring-he does have exertional element to this pain such as if he walks down the hall briskly- he also has mild hyperlipidemia (no family history of CAD) which is untreated at this point- we discussed and think it is prudent to get cardiology's opinion-referral was placed today.  If he has new or worsening symptoms I still want him to seek care prior to that visit.  He may have some sort of muscular strain but there is no clear etiology for what may have triggered that.  He does have a somewhat tender lipoma over the xiphoid but I do not think that is causing his pain-does not reproduce left shoulder pain radiating to the back when pressed on.  This area does make it difficult for him to feel like he can take a deep breath but with CT angiogram of the chest being reassuring I do not think we need further work-up at this time.  Does have some epigastric pain- in case there is an element of reflux I want him to try omeprazole until visit
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
## 2020-09-03 ENCOUNTER — Ambulatory Visit: Payer: Federal, State, Local not specified - PPO | Admitting: Family Medicine

## 2020-09-03 ENCOUNTER — Other Ambulatory Visit: Payer: Self-pay

## 2020-09-03 ENCOUNTER — Encounter: Payer: Self-pay | Admitting: Family Medicine

## 2020-09-03 VITALS — BP 128/86 | HR 85 | Temp 98.9°F | Ht 69.0 in | Wt 207.0 lb

## 2020-09-03 DIAGNOSIS — R0789 Other chest pain: Secondary | ICD-10-CM | POA: Diagnosis not present

## 2020-09-03 MED ORDER — OMEPRAZOLE 40 MG PO CPDR: 40.0000 mg | DELAYED_RELEASE_CAPSULE | Freq: Every day | ORAL | 0 refills | Status: DC

## 2020-09-03 NOTE — Addendum Note (Signed)
Addended by: Linton Ham on: 09/03/2020 03:01 PM   Modules accepted: Orders

## 2020-09-03 NOTE — Addendum Note (Signed)
Addended by: Linton Ham on: 09/03/2020 03:06 PM   Modules accepted: Orders

## 2020-09-25 ENCOUNTER — Ambulatory Visit: Payer: Federal, State, Local not specified - PPO | Admitting: Cardiology

## 2020-09-25 ENCOUNTER — Other Ambulatory Visit: Payer: Self-pay

## 2020-09-25 ENCOUNTER — Other Ambulatory Visit: Payer: Self-pay | Admitting: Family Medicine

## 2020-09-25 ENCOUNTER — Encounter: Payer: Self-pay | Admitting: Cardiology

## 2020-09-25 VITALS — BP 125/89 | HR 74 | Ht 69.0 in | Wt 201.0 lb

## 2020-09-25 DIAGNOSIS — Q248 Other specified congenital malformations of heart: Secondary | ICD-10-CM

## 2020-09-25 DIAGNOSIS — R079 Chest pain, unspecified: Secondary | ICD-10-CM | POA: Diagnosis not present

## 2020-09-25 MED ORDER — CELECOXIB 50 MG PO CAPS: 150.0000 mg | ORAL_CAPSULE | Freq: Two times a day (BID) | ORAL | 0 refills | Status: DC

## 2020-09-25 NOTE — Addendum Note (Signed)
Addended by: Resa Miner I on: 09/25/2020 09:04 AM   Modules accepted: Orders

## 2020-09-25 NOTE — Patient Instructions (Addendum)
Medication Instructions:  Your physician has recommended you make the following change in your medication: START: Cellebrex 150 mg take three tablets by mouth twice daily.  *If you need a refill on your cardiac medications before your next appointment, please call your pharmacy*   Lab Work: None If you have labs (blood work) drawn today and your tests are completely normal, you will receive your results only by: Marland Kitchen MyChart Message (if you have MyChart) OR . A paper copy in the mail If you have any lab test that is abnormal or we need to change your treatment, we will call you to review the results.   Testing/Procedures: Your physician has requested that you have a stress echocardiogram. For further information please visit [email or web address]. Please follow instruction sheet as given.  We have put in an order for you to have a CT calcium score. They will call you directly to schedule this appointment.   Your physician has requested that you have an echocardiogram. Echocardiography is a painless test that uses sound waves to create images of your heart. It provides your doctor with information about the size and shape of your heart and how well your heart's chambers and valves are working. This procedure takes approximately one hour. There are no restrictions for this procedure.    Follow-Up: At [facility], you and your health needs are our priority.  As part of our continuing mission to provide you with exceptional heart care, we have created designated Provider Care Teams.  These Care Teams include your primary Cardiologist (physician) and Advanced Practice Providers (APPs -  Physician Assistants and Nurse Practitioners) who all work together to provide you with the care you need, when you need it.  We recommend signing up for the patient portal called "MyChart".  Sign up information is provided on this After Visit Summary.  MyChart is used to connect with patients for Virtual Visits  (Telemedicine).  Patients are able to view lab/test results, encounter notes, upcoming appointments, etc.  Non-urgent messages can be sent to your provider as well.   To learn more about what you can do with MyChart, go to [email or web address].    Your next appointment:   6 week(s)  The format for your next appointment:   In Person  Provider:   Shirlee More, MD or Berniece Salines, MD   Other Instructions
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
## 2020-09-29 NOTE — Addendum Note (Signed)
Addended by: Shirlee More on: 09/29/2020 12:13 PM   Modules accepted: Orders

## 2020-10-12 ENCOUNTER — Other Ambulatory Visit: Payer: Self-pay | Admitting: Family Medicine

## 2020-10-22 ENCOUNTER — Telehealth: Payer: Self-pay | Admitting: *Deleted

## 2020-10-22 ENCOUNTER — Other Ambulatory Visit: Payer: Self-pay

## 2020-10-22 ENCOUNTER — Ambulatory Visit (INDEPENDENT_AMBULATORY_CARE_PROVIDER_SITE_OTHER)
Admission: RE | Admit: 2020-10-22 | Discharge: 2020-10-22 | Disposition: A | Discharge status: Home / Self Care | Payer: Self-pay | Source: Ambulatory Visit | Attending: Cardiology | Admitting: Cardiology

## 2020-10-22 ENCOUNTER — Ambulatory Visit (HOSPITAL_COMMUNITY): Payer: Federal, State, Local not specified - PPO | Attending: Cardiology

## 2020-10-22 DIAGNOSIS — Q248 Other specified congenital malformations of heart: Secondary | ICD-10-CM | POA: Insufficient documentation

## 2020-10-22 DIAGNOSIS — R079 Chest pain, unspecified: Secondary | ICD-10-CM

## 2020-10-22 LAB — ECHOCARDIOGRAM COMPLETE
Area-P 1/2: 4.31 cm2
S' Lateral: 2.5 cm

## 2020-10-22 MED ORDER — CELECOXIB 50 MG PO CAPS: 150.0000 mg | ORAL_CAPSULE | Freq: Two times a day (BID) | ORAL | 0 refills | Status: DC

## 2020-10-22 NOTE — Telephone Encounter (Signed)
Rx refill sent to pharmacy. 

## 2020-10-23 ENCOUNTER — Encounter: Payer: Self-pay | Admitting: Family Medicine

## 2020-10-23 DIAGNOSIS — E785 Hyperlipidemia, unspecified: Secondary | ICD-10-CM | POA: Insufficient documentation

## 2020-10-30 ENCOUNTER — Ambulatory Visit: Payer: Federal, State, Local not specified - PPO | Admitting: Cardiology

## 2020-11-12 ENCOUNTER — Telehealth (HOSPITAL_COMMUNITY): Payer: Self-pay

## 2020-11-12 NOTE — Telephone Encounter (Signed)
Detailed instructions left on the patient's answering machine. Asked to call back with any questions. S.Clem Wisenbaker EMTP 

## 2020-11-17 ENCOUNTER — Ambulatory Visit (HOSPITAL_COMMUNITY): Payer: Federal, State, Local not specified - PPO

## 2020-11-17 ENCOUNTER — Other Ambulatory Visit: Payer: Self-pay

## 2020-11-17 ENCOUNTER — Ambulatory Visit (HOSPITAL_COMMUNITY): Payer: Federal, State, Local not specified - PPO | Attending: Cardiology

## 2020-11-17 ENCOUNTER — Telehealth: Payer: Self-pay

## 2020-11-17 DIAGNOSIS — R079 Chest pain, unspecified: Secondary | ICD-10-CM | POA: Insufficient documentation

## 2020-11-17 DIAGNOSIS — Q248 Other specified congenital malformations of heart: Secondary | ICD-10-CM

## 2020-11-17 MED ORDER — PERFLUTREN LIPID MICROSPHERE
1.0000 mL | INTRAVENOUS | Status: AC | PRN
  Administered 2020-11-17: 6 mL via INTRAVENOUS

## 2020-11-17 NOTE — Telephone Encounter (Signed)
Spoke with patient regarding results and recommendation.  He states that he does not feel like the celebrex has helped much at all but he is seeing Dr. Harriet Masson tomorrow and will discuss this with her then  Patient verbalizes understanding and is agreeable to plan of care. Advised patient to call back with any issues or concerns.

## 2020-11-17 NOTE — Telephone Encounter (Signed)
-----   Message from Richardo Priest, MD sent at 11/17/2020  2:42 PM EDT ----- Normal or stable result  This is a good report normal echocardiogram no pericardial abnormality is present  Please ask if he is improved with  nonsteroidal anti-inflammatory drug.

## 2020-11-18 ENCOUNTER — Encounter: Payer: Self-pay | Admitting: Cardiology

## 2020-11-18 ENCOUNTER — Ambulatory Visit (INDEPENDENT_AMBULATORY_CARE_PROVIDER_SITE_OTHER): Payer: Federal, State, Local not specified - PPO

## 2020-11-18 ENCOUNTER — Ambulatory Visit: Payer: Federal, State, Local not specified - PPO | Admitting: Cardiology

## 2020-11-18 VITALS — BP 118/84 | HR 80 | Ht 69.0 in | Wt 208.0 lb

## 2020-11-18 DIAGNOSIS — E669 Obesity, unspecified: Secondary | ICD-10-CM | POA: Insufficient documentation

## 2020-11-18 DIAGNOSIS — R55 Syncope and collapse: Secondary | ICD-10-CM | POA: Diagnosis not present

## 2020-11-18 DIAGNOSIS — R079 Chest pain, unspecified: Secondary | ICD-10-CM | POA: Diagnosis not present

## 2020-11-18 DIAGNOSIS — Q248 Other specified congenital malformations of heart: Secondary | ICD-10-CM | POA: Insufficient documentation
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
## 2020-11-18 NOTE — Patient Instructions (Addendum)
Medication Instructions:  Your physician recommends that you continue on your current medications as directed. Please refer to the Current Medication list given to you today.  *If you need a refill on your cardiac medications before your next appointment, please call your pharmacy*   Lab Work: None If you have labs (blood work) drawn today and your tests are completely normal, you will receive your results only by: [place] (if you have MyChart) OR A paper copy in the mail If you have any lab test that is abnormal or we need to change your treatment, we will call you to review the results.   Testing/Procedures: A zio monitor was ordered today. It will remain on for 14 days. You will then return monitor and event diary in provided box. It takes 1-2 weeks for report to be downloaded and returned to [place]. We will call you with the results. If monitor falls off or has orange flashing light, please call Zio for further instructions.   Your physician has requested that you have a cardiac MRI. Cardiac MRI uses a computer to create images of your heart as its beating, producing both still and moving pictures of your heart and major blood vessels. For further information please visit [email or web address] Please follow the instruction sheet given to you today for more information.   Follow-Up: At [facility], you and your health needs are our priority.  As part of our continuing mission to provide you with exceptional heart care, we have created designated Provider Care Teams.  These Care Teams include your primary Cardiologist (physician) and Advanced Practice Providers (APPs -  Physician Assistants and Nurse Practitioners) who all work together to provide you with the care you need, when you need it.  We recommend signing up for the patient portal called "MyChart".  Sign up information is provided on this After Visit Summary.  MyChart is used to connect with patients for Virtual Visits  (Telemedicine).  Patients are able to view lab/test results, encounter notes, upcoming appointments, etc.  Non-urgent messages can be sent to your provider as well.   To learn more about what you can do with MyChart, go to [email or web address].    Your next appointment:   3 month(s)  The format for your next appointment:   In Person  Provider:   [address] - Kardie Tobb, DO    Other Instructions ZIO  WHY IS MY DOCTOR PRESCRIBING ZIO? The Zio system is proven and trusted by physicians to detect and diagnose irregular heart rhythms -- and has been prescribed to hundreds of thousands of patients.  The FDA has cleared the Zio system to monitor for many different kinds of irregular heart rhythms. In a study, physicians were able to reach a diagnosis 90% of the time with the Zio system1.  You can wear the Zio monitor -- a small, discreet, comfortable patch -- during your normal day-to-day activity, including while you sleep, shower, and exercise, while it records every single heartbeat for analysis.  1Barrett, P., et al. Comparison of 24 Hour Holter Monitoring Versus 14 Day Novel Adhesive Patch Electrocardiographic Monitoring. [place], 2014.  ZIO VS. HOLTER MONITORING The Zio monitor can be comfortably worn for up to 14 days. Holter monitors can be worn for 24 to 48 hours, limiting the time to record any irregular heart rhythms you may have. Zio is able to capture data for the 51% of patients who have their first symptom-triggered arrhythmia after 48 hours.1  LIVE WITHOUT RESTRICTIONS  The Zio ambulatory cardiac monitor is a small, unobtrusive, and water-resistant patch--you might even forget you're wearing it. The Zio monitor records and stores every beat of your heart, whether you're sleeping, working out, or showering. Remove on: July 20th 2022  Nuclear Medicine Exam A nuclear medicine exam is a safe and painless imaging test. It helps your health care  provider detect and diagnose diseases. It also provides informationabout the ways your organs work and how they are structured. For a nuclear medicine exam, you will be given a radioactive material, called a tracer, that is absorbed by your body's organs. A large scanning machine detects the radioactive tracer and creates pictures of the areas that yourhealth care provider wants to know more about. There are several kinds of nuclear medicine exams. They include the following: CT scan. MRI scan. PET scan. SPECT scan. Tell your health care provider about: Any allergies you have. All medicines you are taking, including vitamins, herbs, eye drops, creams, and over-the-counter medicines. Any problems you or family members have had with anesthetic medicines. Any blood disorders you have. Any surgeries you have had. Any medical conditions you have. Whether you are pregnant, may be pregnant, or are breastfeeding. What are the risks? Generally, this is a safe procedure. However, problems may occur, such as: An allergic reaction to the tracer. This is rare. Exposure to radiation (a small amount). What happens before the procedure? Medicines Ask your health care provider about: Changing or stopping your regular medicines. This is especially important if you are taking diabetes medicines or blood thinners. Taking medicines such as aspirin and ibuprofen. These medicines can thin your blood. Do not take these medicines unless your health care provider tells you to take them. Taking over-the-counter medicines, vitamins, herbs, and supplements. General instructions Follow instructions from your health care provider about eating and drinking restrictions. Do not wear jewelry. Wear loose, comfortable clothing. You may be asked to wear a hospital gown for the procedure. Bring previous imaging studies, such as X-rays, with you to the exam if they are available. What happens during the procedure?  An IV  may be inserted into one of your veins. You will be asked to lie on a table or sit in a chair. You will be given the radioactive tracer. You may get: A pill or liquid to swallow. An injection. Medicine through your IV. A gas to inhale. A large scanning machine will be used to create images of your body. After the pictures are taken, you may have to wait until your health care provider can make sure that enough images were taken. The procedure may vary among health care providers and hospitals. What can I expect after the procedure? It is up to you to get the results of your procedure. Ask your health care provider, or the department that is doing the procedure, when your results will be ready. Also ask: How will I get my results? What are my treatment options? What other tests do I need? What are my next steps? Follow these instructions at home: Drink enough water to keep your urine pale yellow (6-8 glasses). This helps to remove the radioactive tracer from your body. You may return to your normal activities as told by your health care provider. Get help right away if you: Have problems breathing. This symptom may represent a serious problem that is an emergency. Do not wait to see if the symptoms will go away. Get medical help right away. Call your local emergency services (911  in the [place] Do not drive yourself to the hospital. Summary A nuclear medicine exam is a safe and painless imaging test. It provides information about how your organs are working. It is also used to detect and diagnose diseases. During the procedure, you will be given a radioactive tracer. A large scanning machine will create images of your body. You may resume your normal activities after the procedure. Follow your health care provider's instructions. Get help right away if you have problems breathing. This information is not intended to replace advice given to you by your health care provider. Make sure you  discuss any questions you have with your healthcare provider. Document Revised: 09/07/2019 Document Reviewed: 09/07/2019 Elsevier Patient Education  2022 [employer].

## 2020-12-25 ENCOUNTER — Telehealth (HOSPITAL_COMMUNITY): Payer: Self-pay | Admitting: Emergency Medicine

## 2020-12-25 NOTE — Telephone Encounter (Signed)
Attempted to call patient regarding upcoming cardiac MR appointment. Left message on voicemail with name and callback number Clayton Bosserman RN Navigator Cardiac Imaging [facility] [phone] Office [phone] Cell  

## 2020-12-28 ENCOUNTER — Telehealth (HOSPITAL_COMMUNITY): Payer: Self-pay | Admitting: Emergency Medicine

## 2020-12-28 NOTE — Telephone Encounter (Signed)
Reaching out to patient to offer assistance regarding upcoming cardiac imaging study; pt verbalizes understanding of appt date/time, parking situation and where to check in, and verified current allergies; name and call back number provided for further questions should they arise Penn Grissett RN Navigator Cardiac Imaging [facility] [phone] office [phone] cell 

## 2020-12-29 ENCOUNTER — Other Ambulatory Visit: Payer: Self-pay

## 2020-12-29 ENCOUNTER — Ambulatory Visit (HOSPITAL_COMMUNITY)
Admission: RE | Admit: 2020-12-29 | Discharge: 2020-12-29 | Disposition: A | Discharge status: Home / Self Care | Payer: Federal, State, Local not specified - PPO | Source: Ambulatory Visit | Attending: Cardiology | Admitting: Cardiology

## 2020-12-29 DIAGNOSIS — R079 Chest pain, unspecified: Secondary | ICD-10-CM

## 2020-12-29 MED ORDER — GADOBUTROL 1 MMOL/ML IV SOLN
8.0000 mL | Freq: Once | INTRAVENOUS | Status: AC | PRN
  Administered 2020-12-29: 8 mL via INTRAVENOUS

## 2020-12-31 ENCOUNTER — Telehealth (INDEPENDENT_AMBULATORY_CARE_PROVIDER_SITE_OTHER): Payer: Federal, State, Local not specified - PPO | Admitting: Cardiology

## 2020-12-31 DIAGNOSIS — R42 Dizziness and giddiness: Secondary | ICD-10-CM

## 2020-12-31 DIAGNOSIS — R55 Syncope and collapse: Secondary | ICD-10-CM | POA: Diagnosis not present

## 2020-12-31 DIAGNOSIS — Q248 Other specified congenital malformations of heart: Secondary | ICD-10-CM

## 2020-12-31 NOTE — Patient Instructions (Addendum)
Medication Instructions:  Your physician recommends that you continue on your current medications as directed. Please refer to the Current Medication list given to you today.   *If you need a refill on your cardiac medications before your next appointment, please call your pharmacy*   Lab Work: None If you have labs (blood work) drawn today and your tests are completely normal, you will receive your results only by: [place] (if you have MyChart) OR A paper copy in the mail If you have any lab test that is abnormal or we need to change your treatment, we will call you to review the results.   Testing/Procedures: None   Follow-Up: At [facility], you and your health needs are our priority.  As part of our continuing mission to provide you with exceptional heart care, we have created designated Provider Care Teams.  These Care Teams include your primary Cardiologist (physician) and Advanced Practice Providers (APPs -  Physician Assistants and Nurse Practitioners) who all work together to provide you with the care you need, when you need it.  We recommend signing up for the patient portal called "MyChart".  Sign up information is provided on this After Visit Summary.  MyChart is used to connect with patients for Virtual Visits (Telemedicine).  Patients are able to view lab/test results, encounter notes, upcoming appointments, etc.  Non-urgent messages can be sent to your provider as well.   To learn more about what you can do with MyChart, go to [email or web address].    Your next appointment:   1 year(s)  The format for your next appointment:   In Person  Provider:   [place], DO [address], [place], [place] [address]    Other Instructions Cardiothoracic Surgeon Melodie Bouillon, MD Address: [address], [place], [place] [address] Phone: [phone]
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
## 2021-01-06 ENCOUNTER — Encounter: Payer: Self-pay | Admitting: Family Medicine

## 2021-01-06 ENCOUNTER — Ambulatory Visit (INDEPENDENT_AMBULATORY_CARE_PROVIDER_SITE_OTHER): Payer: Federal, State, Local not specified - PPO | Admitting: Family Medicine

## 2021-01-06 ENCOUNTER — Other Ambulatory Visit: Payer: Self-pay

## 2021-01-06 VITALS — BP 110/72 | HR 76 | Temp 97.7°F | Ht 69.0 in | Wt 205.6 lb

## 2021-01-06 DIAGNOSIS — Z125 Encounter for screening for malignant neoplasm of prostate: Secondary | ICD-10-CM | POA: Diagnosis not present

## 2021-01-06 DIAGNOSIS — Z0000 Encounter for general adult medical examination without abnormal findings: Secondary | ICD-10-CM | POA: Diagnosis not present

## 2021-01-06 DIAGNOSIS — R351 Nocturia: Secondary | ICD-10-CM

## 2021-01-06 DIAGNOSIS — E785 Hyperlipidemia, unspecified: Secondary | ICD-10-CM

## 2021-01-06 LAB — COMPREHENSIVE METABOLIC PANEL
ALT: 41 U/L (ref 0–53)
AST: 28 U/L (ref 0–37)
Albumin: 4.1 g/dL (ref 3.5–5.2)
Alkaline Phosphatase: 77 U/L (ref 39–117)
BUN: 15 mg/dL (ref 6–23)
CO2: 24 mEq/L (ref 19–32)
Calcium: 9.2 mg/dL (ref 8.4–10.5)
Chloride: 107 mEq/L (ref 96–112)
Creatinine, Ser: 1.01 mg/dL (ref 0.40–1.50)
GFR: 90.86 mL/min (ref >60.00)
Glucose, Bld: 93 mg/dL (ref 70–99)
Potassium: 4.3 mEq/L (ref 3.5–5.1)
Sodium: 140 mEq/L (ref 135–145)
Total Bilirubin: 0.9 mg/dL (ref 0.2–1.2)
Total Protein: 6.7 g/dL (ref 6.0–8.3)

## 2021-01-06 LAB — CBC WITH DIFFERENTIAL/PLATELET
Basophils Absolute: 0 10*3/uL (ref 0.0–0.1)
Basophils Relative: 0.7 % (ref 0.0–3.0)
Eosinophils Absolute: 0.3 10*3/uL (ref 0.0–0.7)
Eosinophils Relative: 4.2 % (ref 0.0–5.0)
HCT: 42.3 % (ref 39.0–52.0)
Hemoglobin: 14.5 g/dL (ref 13.0–17.0)
Lymphocytes Relative: 30 % (ref 12.0–46.0)
Lymphs Abs: 1.9 10*3/uL (ref 0.7–4.0)
MCHC: 34.2 g/dL (ref 30.0–36.0)
MCV: 93.9 fl (ref 78.0–100.0)
Monocytes Absolute: 0.5 10*3/uL (ref 0.1–1.0)
Monocytes Relative: 7.5 % (ref 3.0–12.0)
Neutro Abs: 3.6 10*3/uL (ref 1.4–7.7)
Neutrophils Relative %: 57.6 % (ref 43.0–77.0)
Platelets: 297 10*3/uL (ref 150.0–400.0)
RBC: 4.5 Mil/uL (ref 4.22–5.81)
RDW: 12.7 % (ref 11.5–15.5)
WBC: 6.3 10*3/uL (ref 4.0–10.5)

## 2021-01-06 LAB — PSA: PSA: 1.01 ng/mL (ref 0.10–4.00)

## 2021-01-06 LAB — LIPID PANEL
Cholesterol: 160 mg/dL (ref 0–200)
HDL: 45.6 mg/dL (ref >39.00)
LDL Cholesterol: 98 mg/dL (ref 0–99)
NonHDL: 114.55
Total CHOL/HDL Ratio: 4
Triglycerides: 85 mg/dL (ref 0.0–149.0)
VLDL: 17 mg/dL (ref 0.0–40.0)

## 2021-01-06 NOTE — Patient Instructions (Addendum)
Please stop by lab before you go If you have mychart- we will send your results within 3 business days of [place] receiving them.  If you do not have mychart- we will call you about results within 5 business days of [place] receiving them.  *please also note that you will see labs on mychart as soon as they post. I will later go in and write notes on them- will say "notes from Dr. Yong Channel"  Declined COVID-19 vaccination and Flu vaccination at this time.  We can use visit today to write your letter in February- just send me a message maybe 3 weeks ahead of being due.   Recommended follow up: Return in about 1 year (around 01/06/2022) for physical or sooner if needed.Marland Kitchen

## 2021-01-15 ENCOUNTER — Other Ambulatory Visit: Payer: Self-pay | Admitting: *Deleted

## 2021-01-15 ENCOUNTER — Encounter: Payer: Self-pay | Admitting: Thoracic Surgery (Cardiothoracic Vascular Surgery)

## 2021-01-15 ENCOUNTER — Other Ambulatory Visit: Payer: Self-pay

## 2021-01-15 ENCOUNTER — Encounter: Payer: Self-pay | Admitting: *Deleted

## 2021-01-15 ENCOUNTER — Institutional Professional Consult (permissible substitution): Payer: Federal, State, Local not specified - PPO | Admitting: Thoracic Surgery (Cardiothoracic Vascular Surgery)

## 2021-01-15 VITALS — BP 130/85 | HR 88 | Resp 20 | Ht 69.0 in | Wt 202.0 lb

## 2021-01-15 DIAGNOSIS — Q248 Other specified congenital malformations of heart: Secondary | ICD-10-CM
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
## 2021-02-19 NOTE — Progress Notes (Signed)
Surgical Instructions   Your procedure is scheduled on Wednesday 02/24/2021.  Report to [facility] Main Entrance "A" at 06:30 A.M., then check in with the Admitting office.  Call [phone] if you have problems or questions between now and the morning of surgery:   Remember: Do not eat or drink after midnight the night before your surgery    Take these medicines the morning of surgery with A SIP OF WATER:  Omeprazole (Prilosec)   As of today, STOP taking any Aspirin (unless otherwise instructed by your surgeon) or Aspirin-containing products; NSAIDS - Aleve, Naproxen, Ibuprofen, Motrin, Advil, Goody's, BC's, all herbal medications, fish oil, and all vitamins.   After your COVID test   You are not required to quarantine however you are required to wear a well-fitting mask when you are out and around people not in your household.  If your mask becomes wet or soiled, replace with a new one.  Wash your hands often with soap and water for 20 seconds or clean your hands with an alcohol-based hand sanitizer that contains at least 60% alcohol.  Do not share personal items.  Notify your provider: if you are in close contact with someone who has COVID  or if you develop a fever of 100.4 or greater, sneezing, cough, sore throat, shortness of breath or body aches.          Do not wear jewelry  Do not wear lotions, powders, colognes, or deodorant.  Do not shave 48 hours prior to surgery.  Men may shave face and neck.  Do not bring valuables to the hospital - [facility] is not responsible for any belongings or valuables.  Do NOT Smoke (Tobacco/Vaping) or drink Alcohol 24 hours prior to your procedure  If you use a CPAP at night, you may bring all equipment for your overnight stay.   Contacts, glasses, hearing aids, dentures or partials may not be worn into surgery, please bring cases for these belongings   For patients admitted to the hospital, discharge time will be determined by  your treatment team.   Patients discharged the day of surgery will not be allowed to drive home, and someone needs to stay with them for 24 hours.  NO VISITORS WILL BE ALLOWED IN PRE-OP WHERE PATIENTS ARE PREPPED FOR SURGERY.  ONLY 1 SUPPORT PERSON MAY BE PRESENT IN THE WAITING ROOM WHILE YOU ARE IN SURGERY.  IF YOU ARE TO BE ADMITTED, ONCE YOU ARE IN YOUR ROOM YOU WILL BE ALLOWED TWO (2) VISITORS. 1 (ONE) VISITOR MAY STAY OVERNIGHT BUT MUST ARRIVE TO THE ROOM BY 8pm.  Minor children may have two parents present. Special consideration for safety and communication needs will be reviewed on a case by case basis.  Special instructions:    Oral Hygiene is also important to reduce your risk of infection.  Remember - BRUSH YOUR TEETH THE MORNING OF SURGERY WITH YOUR REGULAR TOOTHPASTE   [facility]- Preparing For Surgery  Before surgery, you can play an important role. Because skin is not sterile, your skin needs to be as free of germs as possible. You can reduce the number of germs on your skin by washing with CHG (chlorahexidine gluconate) Soap before surgery.  CHG is an antiseptic cleaner which kills germs and bonds with the skin to continue killing germs even after washing.     Please do not use if you have an allergy to CHG or antibacterial soaps. If your skin becomes reddened/irritated stop using the CHG.  Do not shave (including legs and underarms) for at least 48 hours prior to first CHG shower. It is OK to shave your face.  Please follow these instructions carefully.     Shower the NIGHT BEFORE SURGERY and the MORNING OF SURGERY with CHG Soap.   If you chose to wash your hair, wash your hair first as usual with your normal shampoo. After you shampoo, rinse your hair and body thoroughly to remove the shampoo.    Then [employer] and genitals (private parts) with your normal soap and rinse thoroughly to remove soap.  Next use the CHG Soap as you would any other liquid soap. You can apply  CHG directly to the skin and wash gently with a clean washcloth.   Apply the CHG Soap to your body ONLY FROM THE NECK DOWN.  Do not use on open wounds or open sores. Avoid contact with your eyes, ears, mouth and genitals (private parts). Wash Face and genitals (private parts)  with your normal soap.   Wash thoroughly, paying special attention to the area where your surgery will be performed.  Thoroughly rinse your body with warm water from the neck down.  DO NOT shower/wash with your normal soap after using and rinsing off the CHG Soap.  Pat yourself dry with a CLEAN TOWEL.  Wear CLEAN PAJAMAS to bed the night before surgery  Place CLEAN SHEETS on your bed the night before your surgery  DO NOT SLEEP WITH PETS.   Day of Surgery:  Take a shower with CHG soap. Wear Clean/Comfortable clothing the morning of surgery Do not apply any deodorants/lotions.   Remember to brush your teeth WITH YOUR REGULAR TOOTHPASTE.   Please read over the following fact sheets that you were given.

## 2021-02-22 ENCOUNTER — Encounter (HOSPITAL_COMMUNITY): Payer: Self-pay

## 2021-02-22 ENCOUNTER — Ambulatory Visit (HOSPITAL_COMMUNITY)
Admission: RE | Admit: 2021-02-22 | Discharge: 2021-02-22 | Disposition: A | Discharge status: Home / Self Care | Payer: Federal, State, Local not specified - PPO | Source: Ambulatory Visit | Attending: Thoracic Surgery (Cardiothoracic Vascular Surgery) | Admitting: Thoracic Surgery (Cardiothoracic Vascular Surgery)

## 2021-02-22 ENCOUNTER — Other Ambulatory Visit: Payer: Self-pay

## 2021-02-22 ENCOUNTER — Encounter (HOSPITAL_COMMUNITY)
Admission: RE | Admit: 2021-02-22 | Discharge: 2021-02-22 | Disposition: A | Discharge status: Home / Self Care | Payer: Federal, State, Local not specified - PPO | Source: Ambulatory Visit | Attending: Thoracic Surgery (Cardiothoracic Vascular Surgery) | Admitting: Thoracic Surgery (Cardiothoracic Vascular Surgery)

## 2021-02-22 DIAGNOSIS — R222 Localized swelling, mass and lump, trunk: Secondary | ICD-10-CM | POA: Insufficient documentation

## 2021-02-22 DIAGNOSIS — Q248 Other specified congenital malformations of heart: Secondary | ICD-10-CM

## 2021-02-22 DIAGNOSIS — Z01818 Encounter for other preprocedural examination: Secondary | ICD-10-CM | POA: Insufficient documentation

## 2021-02-22 DIAGNOSIS — I318 Other specified diseases of pericardium: Secondary | ICD-10-CM | POA: Diagnosis not present

## 2021-02-22 DIAGNOSIS — K219 Gastro-esophageal reflux disease without esophagitis: Secondary | ICD-10-CM | POA: Diagnosis not present

## 2021-02-22 DIAGNOSIS — Z8249 Family history of ischemic heart disease and other diseases of the circulatory system: Secondary | ICD-10-CM | POA: Diagnosis not present

## 2021-02-22 DIAGNOSIS — Z8042 Family history of malignant neoplasm of prostate: Secondary | ICD-10-CM | POA: Diagnosis not present

## 2021-02-22 DIAGNOSIS — Z20822 Contact with and (suspected) exposure to covid-19: Secondary | ICD-10-CM | POA: Insufficient documentation

## 2021-02-22 DIAGNOSIS — Z79899 Other long term (current) drug therapy: Secondary | ICD-10-CM | POA: Diagnosis not present

## 2021-02-22 HISTORY — DX: Pneumonia, unspecified organism: J18.9

## 2021-02-22 HISTORY — DX: Other complications of anesthesia, initial encounter: T88.59XA

## 2021-02-22 LAB — COMPREHENSIVE METABOLIC PANEL
ALT: 41 U/L (ref 0–44)
AST: 32 U/L (ref 15–41)
Albumin: 4 g/dL (ref 3.5–5.0)
Alkaline Phosphatase: 71 U/L (ref 38–126)
Anion gap: 9 (ref 5–15)
BUN: 9 mg/dL (ref 6–20)
CO2: 21 mmol/L — ABNORMAL LOW (ref 22–32)
Calcium: 8.8 mg/dL — ABNORMAL LOW (ref 8.9–10.3)
Chloride: 105 mmol/L (ref 98–111)
Creatinine, Ser: 0.93 mg/dL (ref 0.61–1.24)
GFR, Estimated: >60 mL/min (ref >60)
Glucose, Bld: 96 mg/dL (ref 70–99)
Potassium: 4.2 mmol/L (ref 3.5–5.1)
Sodium: 135 mmol/L (ref 135–145)
Total Bilirubin: 0.9 mg/dL (ref 0.3–1.2)
Total Protein: 7.1 g/dL (ref 6.5–8.1)

## 2021-02-22 LAB — CBC
HCT: 46.7 % (ref 39.0–52.0)
Hemoglobin: 15.8 g/dL (ref 13.0–17.0)
MCH: 31.5 pg (ref 26.0–34.0)
MCHC: 33.8 g/dL (ref 30.0–36.0)
MCV: 93.2 fL (ref 80.0–100.0)
Platelets: 341 10*3/uL (ref 150–400)
RBC: 5.01 MIL/uL (ref 4.22–5.81)
RDW: 11.9 % (ref 11.5–15.5)
WBC: 8.2 10*3/uL (ref 4.0–10.5)
nRBC: 0 % (ref 0.0–0.2)

## 2021-02-22 LAB — BLOOD GAS, ARTERIAL
Acid-base deficit: 1.4 mmol/L (ref 0.0–2.0)
Bicarbonate: 22.6 mmol/L (ref 20.0–28.0)
FIO2: 21
O2 Saturation: 98.6 %
Patient temperature: 37
pCO2 arterial: 36 mmHg (ref 32.0–48.0)
pH, Arterial: 7.413 (ref 7.350–7.450)
pO2, Arterial: 112 mmHg — ABNORMAL HIGH (ref 83.0–108.0)

## 2021-02-22 LAB — PROTIME-INR
INR: 1 (ref 0.8–1.2)
Prothrombin Time: 13.2 seconds (ref 11.4–15.2)

## 2021-02-22 LAB — SURGICAL PCR SCREEN
MRSA, PCR: NEGATIVE
Staphylococcus aureus: POSITIVE — AB

## 2021-02-22 LAB — TYPE AND SCREEN
ABO/RH(D): O POS
Antibody Screen: NEGATIVE

## 2021-02-22 LAB — SARS CORONAVIRUS 2 (TAT 6-24 HRS): SARS Coronavirus 2: NEGATIVE

## 2021-02-22 LAB — APTT: aPTT: 29 seconds (ref 24–36)

## 2021-02-22 NOTE — Progress Notes (Addendum)
PCP - Dr. Garret Reddish Cardiologist - Dr. Godfrey Pick Tobb  Chest x-ray - 02/22/21 EKG - 02/22/21 Stress Test - 11/17/20 ECHO - 10/22/20 Cardiac Cath - Denies  Sleep Study - Denies OSA  COVID TEST- 02/22/21   Anesthesia review: Yes cardiac history  Patient denies shortness of breath, fever, cough and chest pain at PAT appointment   All instructions explained to the patient, with a verbal understanding of the material. Patient agrees to go over the instructions while at home for a better understanding. Patient also instructed to wear a mask while in public after being tested for COVID-19. The opportunity to ask questions was provided.

## 2021-02-22 NOTE — Progress Notes (Signed)
Received a call from the lab that the urine specimen spilled. Called patient and he is going to come back on 02/23/21 to give another sample around 1300.

## 2021-02-23 LAB — URINALYSIS, ROUTINE W REFLEX MICROSCOPIC
Bilirubin Urine: NEGATIVE
Glucose, UA: NEGATIVE mg/dL
Hgb urine dipstick: NEGATIVE
Ketones, ur: NEGATIVE mg/dL
Leukocytes,Ua: NEGATIVE
Nitrite: NEGATIVE
Protein, ur: NEGATIVE mg/dL
Specific Gravity, Urine: 1.02 (ref 1.005–1.030)
pH: 6 (ref 5.0–8.0)

## 2021-02-24 ENCOUNTER — Inpatient Hospital Stay (HOSPITAL_COMMUNITY): Payer: Federal, State, Local not specified - PPO | Admitting: Physician Assistant

## 2021-02-24 ENCOUNTER — Other Ambulatory Visit: Payer: Self-pay

## 2021-02-24 ENCOUNTER — Inpatient Hospital Stay (HOSPITAL_COMMUNITY): Payer: Federal, State, Local not specified - PPO | Admitting: Anesthesiology

## 2021-02-24 ENCOUNTER — Encounter (HOSPITAL_COMMUNITY)
Admission: RE | Disposition: A | Discharge status: Home / Self Care | Payer: Self-pay | Source: Home / Self Care | Attending: Thoracic Surgery (Cardiothoracic Vascular Surgery)

## 2021-02-24 ENCOUNTER — Inpatient Hospital Stay (HOSPITAL_COMMUNITY)
Admission: RE | Admit: 2021-02-24 | Discharge: 2021-02-25 | DRG: 272 | Disposition: A | Discharge status: Home / Self Care | Payer: Federal, State, Local not specified - PPO | Attending: Thoracic Surgery (Cardiothoracic Vascular Surgery) | Admitting: Thoracic Surgery (Cardiothoracic Vascular Surgery)

## 2021-02-24 ENCOUNTER — Inpatient Hospital Stay (HOSPITAL_COMMUNITY): Payer: Federal, State, Local not specified - PPO

## 2021-02-24 ENCOUNTER — Encounter (HOSPITAL_COMMUNITY): Payer: Self-pay | Admitting: Thoracic Surgery (Cardiothoracic Vascular Surgery)

## 2021-02-24 DIAGNOSIS — Z8249 Family history of ischemic heart disease and other diseases of the circulatory system: Secondary | ICD-10-CM | POA: Diagnosis not present

## 2021-02-24 DIAGNOSIS — J939 Pneumothorax, unspecified: Secondary | ICD-10-CM

## 2021-02-24 DIAGNOSIS — I318 Other specified diseases of pericardium: Principal | ICD-10-CM | POA: Diagnosis present

## 2021-02-24 DIAGNOSIS — Z8042 Family history of malignant neoplasm of prostate: Secondary | ICD-10-CM

## 2021-02-24 DIAGNOSIS — K219 Gastro-esophageal reflux disease without esophagitis: Secondary | ICD-10-CM | POA: Diagnosis present

## 2021-02-24 DIAGNOSIS — E785 Hyperlipidemia, unspecified: Secondary | ICD-10-CM | POA: Diagnosis not present

## 2021-02-24 DIAGNOSIS — Z79899 Other long term (current) drug therapy: Secondary | ICD-10-CM

## 2021-02-24 DIAGNOSIS — I319 Disease of pericardium, unspecified: Secondary | ICD-10-CM | POA: Diagnosis not present

## 2021-02-24 DIAGNOSIS — D1721 Benign lipomatous neoplasm of skin and subcutaneous tissue of right arm: Secondary | ICD-10-CM | POA: Diagnosis not present

## 2021-02-24 DIAGNOSIS — J9811 Atelectasis: Secondary | ICD-10-CM | POA: Diagnosis not present

## 2021-02-24 DIAGNOSIS — J9859 Other diseases of mediastinum, not elsewhere classified: Secondary | ICD-10-CM | POA: Diagnosis present

## 2021-02-24 DIAGNOSIS — Z20822 Contact with and (suspected) exposure to covid-19: Secondary | ICD-10-CM | POA: Diagnosis present

## 2021-02-24 DIAGNOSIS — Q248 Other specified congenital malformations of heart: Secondary | ICD-10-CM

## 2021-02-24 HISTORY — PX: THYMECTOMY: SHX1063

## 2021-02-24 LAB — COMPREHENSIVE METABOLIC PANEL
ALT: 58 U/L — ABNORMAL HIGH (ref 0–44)
AST: 51 U/L — ABNORMAL HIGH (ref 15–41)
Albumin: 3.7 g/dL (ref 3.5–5.0)
Alkaline Phosphatase: 71 U/L (ref 38–126)
Anion gap: 11 (ref 5–15)
BUN: 11 mg/dL (ref 6–20)
CO2: 23 mmol/L (ref 22–32)
Calcium: 9 mg/dL (ref 8.9–10.3)
Chloride: 103 mmol/L (ref 98–111)
Creatinine, Ser: 1.28 mg/dL — ABNORMAL HIGH (ref 0.61–1.24)
GFR, Estimated: >60 mL/min (ref >60)
Glucose, Bld: 156 mg/dL — ABNORMAL HIGH (ref 70–99)
Potassium: 4.3 mmol/L (ref 3.5–5.1)
Sodium: 137 mmol/L (ref 135–145)
Total Bilirubin: 0.9 mg/dL (ref 0.3–1.2)
Total Protein: 6.7 g/dL (ref 6.5–8.1)

## 2021-02-24 LAB — CBC
HCT: 43.9 % (ref 39.0–52.0)
Hemoglobin: 15.4 g/dL (ref 13.0–17.0)
MCH: 32.5 pg (ref 26.0–34.0)
MCHC: 35.1 g/dL (ref 30.0–36.0)
MCV: 92.6 fL (ref 80.0–100.0)
Platelets: 340 10*3/uL (ref 150–400)
RBC: 4.74 MIL/uL (ref 4.22–5.81)
RDW: 11.8 % (ref 11.5–15.5)
WBC: 14.3 10*3/uL — ABNORMAL HIGH (ref 4.0–10.5)
nRBC: 0 % (ref 0.0–0.2)

## 2021-02-24 LAB — ABO/RH: ABO/RH(D): O POS

## 2021-02-24 SURGERY — EXCISION, MASS, MEDIASTINUM, ROBOT-ASSISTED
Anesthesia: General | Site: Abdomen | Laterality: Right

## 2021-02-24 MED ORDER — ACETAMINOPHEN 325 MG PO TABS: 325.0000 mg | ORAL_TABLET | Freq: Once | ORAL | Status: DC | PRN

## 2021-02-24 MED ORDER — ONDANSETRON HCL 4 MG/2ML IJ SOLN
4.0000 mg | Freq: Four times a day (QID) | INTRAMUSCULAR | Status: DC | PRN
Start: 2021-02-24 — End: 2021-02-25

## 2021-02-24 MED ORDER — HYDROMORPHONE HCL 1 MG/ML IJ SOLN
INTRAMUSCULAR | Status: AC
  Filled 2021-02-24: qty 1

## 2021-02-24 MED ORDER — DEXMEDETOMIDINE (PRECEDEX) IN NS 20 MCG/5ML (4 MCG/ML) IV SYRINGE
PREFILLED_SYRINGE | INTRAVENOUS | Status: DC | PRN
  Administered 2021-02-24: 12 ug via INTRAVENOUS
  Administered 2021-02-24: 20 ug via INTRAVENOUS

## 2021-02-24 MED ORDER — TRAMADOL HCL 50 MG PO TABS: 50.0000 mg | ORAL_TABLET | Freq: Four times a day (QID) | ORAL | Status: DC | PRN

## 2021-02-24 MED ORDER — ROCURONIUM BROMIDE 10 MG/ML (PF) SYRINGE
PREFILLED_SYRINGE | INTRAVENOUS | Status: AC
  Filled 2021-02-24: qty 10

## 2021-02-24 MED ORDER — ACETAMINOPHEN 160 MG/5ML PO SOLN: 325.0000 mg | Freq: Once | ORAL | Status: DC | PRN

## 2021-02-24 MED ORDER — ENOXAPARIN SODIUM 40 MG/0.4ML IJ SOSY
40.0000 mg | PREFILLED_SYRINGE | Freq: Every day | INTRAMUSCULAR | Status: DC
  Administered 2021-02-25: 40 mg via SUBCUTANEOUS
  Filled 2021-02-24: qty 0.4

## 2021-02-24 MED ORDER — ACETAMINOPHEN 10 MG/ML IV SOLN
INTRAVENOUS | Status: AC
  Filled 2021-02-24: qty 100

## 2021-02-24 MED ORDER — SODIUM CHLORIDE FLUSH 0.9 % IV SOLN
INTRAVENOUS | Status: DC | PRN
  Administered 2021-02-24: 100 mL

## 2021-02-24 MED ORDER — HYDROMORPHONE HCL 1 MG/ML IJ SOLN
0.2500 mg | INTRAMUSCULAR | Status: DC | PRN
  Administered 2021-02-24 (×2): 0.5 mg via INTRAVENOUS

## 2021-02-24 MED ORDER — ONDANSETRON HCL 4 MG/2ML IJ SOLN: 4.0000 mg | Freq: Four times a day (QID) | INTRAMUSCULAR | Status: DC | PRN

## 2021-02-24 MED ORDER — SENNOSIDES-DOCUSATE SODIUM 8.6-50 MG PO TABS
1.0000 | ORAL_TABLET | Freq: Every day | ORAL | Status: DC
  Administered 2021-02-24: 1 via ORAL
  Filled 2021-02-24: qty 1

## 2021-02-24 MED ORDER — ONDANSETRON HCL 4 MG/2ML IJ SOLN
INTRAMUSCULAR | Status: AC
  Filled 2021-02-24: qty 2

## 2021-02-24 MED ORDER — PANTOPRAZOLE SODIUM 40 MG PO TBEC
40.0000 mg | DELAYED_RELEASE_TABLET | Freq: Every day | ORAL | Status: DC
  Administered 2021-02-25: 40 mg via ORAL
  Filled 2021-02-24: qty 1

## 2021-02-24 MED ORDER — FENTANYL CITRATE (PF) 250 MCG/5ML IJ SOLN
INTRAMUSCULAR | Status: DC | PRN
  Administered 2021-02-24: 50 ug via INTRAVENOUS
  Administered 2021-02-24: 100 ug via INTRAVENOUS
  Administered 2021-02-24 (×3): 50 ug via INTRAVENOUS

## 2021-02-24 MED ORDER — KETOROLAC TROMETHAMINE 15 MG/ML IJ SOLN
15.0000 mg | Freq: Four times a day (QID) | INTRAMUSCULAR | Status: DC
  Administered 2021-02-24 – 2021-02-25 (×3): 15 mg via INTRAVENOUS
  Filled 2021-02-24 (×3): qty 1

## 2021-02-24 MED ORDER — LACTATED RINGERS IV SOLN: INTRAVENOUS | Status: DC | PRN

## 2021-02-24 MED ORDER — ACETAMINOPHEN 10 MG/ML IV SOLN
1000.0000 mg | Freq: Once | INTRAVENOUS | Status: DC | PRN
  Administered 2021-02-24: 1000 mg via INTRAVENOUS

## 2021-02-24 MED ORDER — MIDAZOLAM HCL 5 MG/5ML IJ SOLN
INTRAMUSCULAR | Status: DC | PRN
  Administered 2021-02-24: 2 mg via INTRAVENOUS

## 2021-02-24 MED ORDER — AMISULPRIDE (ANTIEMETIC) 5 MG/2ML IV SOLN
10.0000 mg | Freq: Once | INTRAVENOUS | Status: AC | PRN
  Administered 2021-02-24: 10 mg via INTRAVENOUS

## 2021-02-24 MED ORDER — DIPHENHYDRAMINE HCL 50 MG/ML IJ SOLN: 12.5000 mg | Freq: Four times a day (QID) | INTRAMUSCULAR | Status: DC | PRN

## 2021-02-24 MED ORDER — CEFAZOLIN SODIUM-DEXTROSE 2-4 GM/100ML-% IV SOLN
2.0000 g | INTRAVENOUS | Status: AC
  Administered 2021-02-24: 2 g via INTRAVENOUS
  Filled 2021-02-24: qty 100

## 2021-02-24 MED ORDER — PROMETHAZINE HCL 25 MG/ML IJ SOLN
6.2500 mg | INTRAMUSCULAR | Status: DC | PRN
  Administered 2021-02-24: 6.25 mg via INTRAVENOUS

## 2021-02-24 MED ORDER — KETOROLAC TROMETHAMINE 30 MG/ML IJ SOLN
30.0000 mg | Freq: Once | INTRAMUSCULAR | Status: AC
  Administered 2021-02-24: 30 mg via INTRAVENOUS

## 2021-02-24 MED ORDER — DEXAMETHASONE SODIUM PHOSPHATE 10 MG/ML IJ SOLN
INTRAMUSCULAR | Status: DC | PRN
  Administered 2021-02-24: 10 mg via INTRAVENOUS

## 2021-02-24 MED ORDER — NALOXONE HCL 0.4 MG/ML IJ SOLN: 0.4000 mg | INTRAMUSCULAR | Status: DC | PRN

## 2021-02-24 MED ORDER — 0.9 % SODIUM CHLORIDE (POUR BTL) OPTIME
TOPICAL | Status: DC | PRN
  Administered 2021-02-24: 2000 mL

## 2021-02-24 MED ORDER — SUGAMMADEX SODIUM 200 MG/2ML IV SOLN
INTRAVENOUS | Status: DC | PRN
  Administered 2021-02-24: 200 mg via INTRAVENOUS

## 2021-02-24 MED ORDER — BUPIVACAINE HCL (PF) 0.5 % IJ SOLN
INTRAMUSCULAR | Status: AC
  Filled 2021-02-24: qty 30

## 2021-02-24 MED ORDER — ACETAMINOPHEN 500 MG PO TABS
1000.0000 mg | ORAL_TABLET | Freq: Four times a day (QID) | ORAL | Status: DC
  Administered 2021-02-24 – 2021-02-25 (×4): 1000 mg via ORAL
  Filled 2021-02-24 (×4): qty 2

## 2021-02-24 MED ORDER — KETOROLAC TROMETHAMINE 30 MG/ML IJ SOLN
INTRAMUSCULAR | Status: AC
  Filled 2021-02-24: qty 1

## 2021-02-24 MED ORDER — HYDROMORPHONE HCL 1 MG/ML IJ SOLN
0.2500 mg | INTRAMUSCULAR | Status: DC | PRN
  Administered 2021-02-24 (×4): 0.5 mg via INTRAVENOUS

## 2021-02-24 MED ORDER — CHLORHEXIDINE GLUCONATE 0.12 % MT SOLN
15.0000 mL | Freq: Once | OROMUCOSAL | Status: AC
  Administered 2021-02-24: 15 mL via OROMUCOSAL
  Filled 2021-02-24: qty 15

## 2021-02-24 MED ORDER — MEPERIDINE HCL 25 MG/ML IJ SOLN
6.2500 mg | INTRAMUSCULAR | Status: DC | PRN
  Administered 2021-02-24: 6.25 mg via INTRAVENOUS
  Administered 2021-02-24: 12.5 mg via INTRAVENOUS

## 2021-02-24 MED ORDER — CEFAZOLIN SODIUM-DEXTROSE 2-4 GM/100ML-% IV SOLN
2.0000 g | Freq: Three times a day (TID) | INTRAVENOUS | Status: AC
  Administered 2021-02-24 – 2021-02-25 (×2): 2 g via INTRAVENOUS
  Filled 2021-02-24 (×2): qty 100

## 2021-02-24 MED ORDER — MIDAZOLAM HCL 2 MG/2ML IJ SOLN
INTRAMUSCULAR | Status: AC
  Filled 2021-02-24: qty 2

## 2021-02-24 MED ORDER — BISACODYL 5 MG PO TBEC
10.0000 mg | DELAYED_RELEASE_TABLET | Freq: Every day | ORAL | Status: DC
  Administered 2021-02-25: 10 mg via ORAL
  Filled 2021-02-24: qty 2

## 2021-02-24 MED ORDER — PROPOFOL 10 MG/ML IV BOLUS
INTRAVENOUS | Status: DC | PRN
  Administered 2021-02-24: 160 mg via INTRAVENOUS

## 2021-02-24 MED ORDER — SODIUM CHLORIDE 0.9% FLUSH: 9.0000 mL | INTRAVENOUS | Status: DC | PRN

## 2021-02-24 MED ORDER — ROCURONIUM BROMIDE 10 MG/ML (PF) SYRINGE
PREFILLED_SYRINGE | INTRAVENOUS | Status: DC | PRN
Start: 2021-02-24 — End: 2021-02-24
  Administered 2021-02-24: 100 mg via INTRAVENOUS
  Administered 2021-02-24: 20 mg via INTRAVENOUS

## 2021-02-24 MED ORDER — LACTATED RINGERS IV SOLN: INTRAVENOUS | Status: DC

## 2021-02-24 MED ORDER — AMISULPRIDE (ANTIEMETIC) 5 MG/2ML IV SOLN
INTRAVENOUS | Status: AC
  Filled 2021-02-24: qty 4

## 2021-02-24 MED ORDER — FENTANYL CITRATE (PF) 250 MCG/5ML IJ SOLN
INTRAMUSCULAR | Status: AC
  Filled 2021-02-24: qty 5

## 2021-02-24 MED ORDER — ACETAMINOPHEN 160 MG/5ML PO SOLN: 1000.0000 mg | Freq: Four times a day (QID) | ORAL | Status: DC

## 2021-02-24 MED ORDER — ORAL CARE MOUTH RINSE: 15.0000 mL | Freq: Once | OROMUCOSAL | Status: AC

## 2021-02-24 MED ORDER — PROMETHAZINE HCL 25 MG/ML IJ SOLN
INTRAMUSCULAR | Status: AC
  Filled 2021-02-24: qty 1

## 2021-02-24 MED ORDER — MEPERIDINE HCL 25 MG/ML IJ SOLN
INTRAMUSCULAR | Status: AC
  Filled 2021-02-24: qty 1

## 2021-02-24 MED ORDER — DEXAMETHASONE SODIUM PHOSPHATE 10 MG/ML IJ SOLN
INTRAMUSCULAR | Status: AC
  Filled 2021-02-24: qty 1

## 2021-02-24 MED ORDER — ONDANSETRON HCL 4 MG/2ML IJ SOLN
INTRAMUSCULAR | Status: DC | PRN
  Administered 2021-02-24: 4 mg via INTRAVENOUS

## 2021-02-24 MED ORDER — BUPIVACAINE LIPOSOME 1.3 % IJ SUSP
INTRAMUSCULAR | Status: AC
  Filled 2021-02-24: qty 20

## 2021-02-24 MED ORDER — LIDOCAINE 2% (20 MG/ML) 5 ML SYRINGE
INTRAMUSCULAR | Status: AC
  Filled 2021-02-24: qty 5

## 2021-02-24 MED ORDER — LIDOCAINE 2% (20 MG/ML) 5 ML SYRINGE
INTRAMUSCULAR | Status: DC | PRN
  Administered 2021-02-24: 40 mg via INTRAVENOUS

## 2021-02-24 MED ORDER — FENTANYL 50 MCG/ML IV PCA SOLN
INTRAVENOUS | Status: DC
Start: 2021-02-24 — End: 2021-02-25
  Administered 2021-02-24: 120 ug via INTRAVENOUS
  Administered 2021-02-24 – 2021-02-25 (×2): 60 ug via INTRAVENOUS
  Administered 2021-02-25: 45 ug via INTRAVENOUS
  Filled 2021-02-24: qty 20

## 2021-02-24 MED ORDER — DIPHENHYDRAMINE HCL 12.5 MG/5ML PO ELIX: 12.5000 mg | ORAL_SOLUTION | Freq: Four times a day (QID) | ORAL | Status: DC | PRN

## 2021-02-24 MED ORDER — PROPOFOL 10 MG/ML IV BOLUS
INTRAVENOUS | Status: AC
  Filled 2021-02-24: qty 20

## 2021-02-24 MED ORDER — DEXMEDETOMIDINE (PRECEDEX) IN NS 20 MCG/5ML (4 MCG/ML) IV SYRINGE
PREFILLED_SYRINGE | INTRAVENOUS | Status: AC
  Filled 2021-02-24: qty 10

## 2021-02-24 SURGICAL SUPPLY — 68 items
ADH SKN CLS APL DERMABOND .7 (GAUZE/BANDAGES/DRESSINGS) ×1
APL PRP STRL LF DISP 70% ISPRP (MISCELLANEOUS) ×1
BAG SPEC RTRVL C125 8X14 (MISCELLANEOUS) ×2
CANNULA REDUC XI 12-8 STAPL (CANNULA) ×1
CANNULA REDUCER 12-8 DVNC XI (CANNULA) ×1 IMPLANT
CATH FOLEY 16FR TEMP PROBE (CATHETERS) ×2 IMPLANT
CHLORAPREP W/TINT 26 (MISCELLANEOUS) ×2 IMPLANT
CONN ST 1/4X3/8  BEN (MISCELLANEOUS) ×1
CONN ST 1/4X3/8 BEN (MISCELLANEOUS) ×1 IMPLANT
DEFOGGER SCOPE WARMER CLEARIFY (MISCELLANEOUS) ×2 IMPLANT
DERMABOND ADVANCED (GAUZE/BANDAGES/DRESSINGS) ×1
DERMABOND ADVANCED .7 DNX12 (GAUZE/BANDAGES/DRESSINGS) ×1 IMPLANT
DRAIN CHANNEL 19F RND (DRAIN) ×2 IMPLANT
DRAPE ARM DVNC X/XI (DISPOSABLE) ×4 IMPLANT
DRAPE COLUMN DVNC XI (DISPOSABLE) ×1 IMPLANT
DRAPE CV SPLIT W-CLR ANES SCRN (DRAPES) ×2 IMPLANT
DRAPE DA VINCI XI ARM (DISPOSABLE) ×4
DRAPE DA VINCI XI COLUMN (DISPOSABLE) ×1
DRAPE ORTHO SPLIT 77X108 STRL (DRAPES) ×2
DRAPE SURG ORHT 6 SPLT 77X108 (DRAPES) ×1 IMPLANT
ELECT REM PT RETURN 9FT ADLT (ELECTROSURGICAL) ×2
ELECTRODE REM PT RTRN 9FT ADLT (ELECTROSURGICAL) ×1 IMPLANT
GAUZE KITTNER 4X8 (MISCELLANEOUS) ×2 IMPLANT
GAUZE SPONGE 4X4 12PLY STRL (GAUZE/BANDAGES/DRESSINGS) ×2 IMPLANT
GLOVE SURG ENC MOIS LTX SZ7 (GLOVE) ×4 IMPLANT
GOWN STRL REUS W/ TWL LRG LVL3 (GOWN DISPOSABLE) ×2 IMPLANT
GOWN STRL REUS W/ TWL XL LVL3 (GOWN DISPOSABLE) ×2 IMPLANT
GOWN STRL REUS W/TWL LRG LVL3 (GOWN DISPOSABLE) ×4
GOWN STRL REUS W/TWL XL LVL3 (GOWN DISPOSABLE) ×4
HEMOSTAT POWDER SURGIFOAM 1G (HEMOSTASIS) IMPLANT
IRRIGATION STRYKERFLOW (MISCELLANEOUS) ×1 IMPLANT
IRRIGATOR STRYKERFLOW (MISCELLANEOUS) ×2
KIT SUCTION CATH 14FR (SUCTIONS) IMPLANT
NEEDLE HYPO 25GX1X1/2 BEV (NEEDLE) IMPLANT
PACK CHEST (CUSTOM PROCEDURE TRAY) ×2 IMPLANT
PAD ARMBOARD 7.5X6 YLW CONV (MISCELLANEOUS) ×4 IMPLANT
PAD ELECT DEFIB RADIOL ZOLL (MISCELLANEOUS) ×2 IMPLANT
SEAL CANN UNIV 5-8 DVNC XI (MISCELLANEOUS) ×3 IMPLANT
SEAL XI 5MM-8MM UNIVERSAL (MISCELLANEOUS) ×3
SEALER VESSEL DA VINCI XI (MISCELLANEOUS) ×1
SEALER VESSEL EXT DVNC XI (MISCELLANEOUS) ×1 IMPLANT
SET TRI-LUMEN FLTR TB AIRSEAL (TUBING) ×2 IMPLANT
SHEET MEDIUM DRAPE 40X70 STRL (DRAPES) ×2 IMPLANT
SOLUTION ELECTROLUBE (MISCELLANEOUS) ×2 IMPLANT
STAPLER CANNULA SEAL DVNC XI (STAPLE) IMPLANT
STAPLER CANNULA SEAL XI (STAPLE)
STOPCOCK 4 WAY LG BORE MALE ST (IV SETS) IMPLANT
SUT BONE WAX W31G (SUTURE) IMPLANT
SUT MNCRL AB 3-0 PS2 18 (SUTURE) IMPLANT
SUT PDS AB 1 CTX 36 (SUTURE) IMPLANT
SUT SILK  1 MH (SUTURE)
SUT SILK 1 MH (SUTURE) IMPLANT
SUT SILK 2 0 SH CR/8 (SUTURE) IMPLANT
SUT STEEL 6MS V (SUTURE) IMPLANT
SUT STEEL SZ 6 DBL 3X14 BALL (SUTURE) IMPLANT
SUT VIC AB 2-0 CT1 27 (SUTURE)
SUT VIC AB 2-0 CT1 TAPERPNT 27 (SUTURE) IMPLANT
SUT VIC AB 2-0 CTX 36 (SUTURE) IMPLANT
SUT VIC AB 3-0 SH 27 (SUTURE)
SUT VIC AB 3-0 SH 27X BRD (SUTURE) IMPLANT
SUT VICRYL 0 UR6 27IN ABS (SUTURE) ×4 IMPLANT
SYR 10ML LL (SYRINGE) ×2 IMPLANT
SYR 50ML LL SCALE MARK (SYRINGE) ×2 IMPLANT
SYSTEM RETRIEVAL ANCHOR 8 (MISCELLANEOUS) ×4 IMPLANT
SYSTEM SAHARA CHEST DRAIN ATS (WOUND CARE) ×2 IMPLANT
TAPE CLOTH 4X10 WHT NS (GAUZE/BANDAGES/DRESSINGS) ×2 IMPLANT
TOWEL GREEN STERILE (TOWEL DISPOSABLE) ×2 IMPLANT
TUBING EXTENTION W/L.L. (IV SETS) ×2 IMPLANT

## 2021-02-24 NOTE — Brief Op Note (Signed)
02/24/2021  10:38 AM  PATIENT:  [name]  45 y.o. male  PRE-OPERATIVE DIAGNOSIS:  PERICARDIAL CYST  POST-OPERATIVE DIAGNOSIS:  PERICARDIAL CYST  PROCEDURE:  Procedure(s): XI ROBOTIC ASSISTED RESECTION OF MEDIASTINAL MASS (Right)  SURGEON:  Surgeon(s) and Role:    * Lightfoot, Lucile Crater, MD - Primary  PHYSICIAN ASSISTANT: Kitai Purdom PA-C   ANESTHESIA:   general  EBL:  25 mL   BLOOD ADMINISTERED:none  DRAINS: (73F) Blake drain(s) in the RIGHT HEMITHORAX    LOCAL MEDICATIONS USED:  EXPAREL  SPECIMEN:  Source of Specimen:  THYMIC MASS  DISPOSITION OF SPECIMEN:  PATHOLOGY  COUNTS:  YES  TOURNIQUET:  * No tourniquets in log *  DICTATION: .Dragon Dictation  PLAN OF CARE: Admit to inpatient   PATIENT DISPOSITION:  PACU - hemodynamically stable.   Delay start of Pharmacological VTE agent (>24hrs) due to surgical blood loss or risk of bleeding: yes  COMPLICATIONS: NO KNOWN

## 2021-02-24 NOTE — Anesthesia Preprocedure Evaluation (Addendum)
Anesthesia Evaluation  Patient identified by MRN, date of birth, ID band Patient awake    Reviewed: Allergy & Precautions, NPO status , Patient's Chart, lab work & pertinent test results  Airway Mallampati: I  TM Distance: >3 FB Neck ROM: Full    Dental  (+) Teeth Intact, Dental Advisory Given   Pulmonary neg pulmonary ROS,    breath sounds clear to auscultation       Cardiovascular negative cardio ROS   Rhythm:Regular Rate:Normal     Neuro/Psych negative neurological ROS  negative psych ROS   GI/Hepatic Neg liver ROS, GERD  Medicated,  Endo/Other  negative endocrine ROS  Renal/GU negative Renal ROS     Musculoskeletal  (+) Arthritis ,   Abdominal Normal abdominal exam  (+)   Peds  Hematology negative hematology ROS (+)   Anesthesia Other Findings   Reproductive/Obstetrics                            Anesthesia Physical Anesthesia Plan  ASA: 3  Anesthesia Plan: General   Post-op Pain Management:    Induction: Intravenous  PONV Risk Score and Plan: 3 and Ondansetron, Dexamethasone and Midazolam  Airway Management Planned: Oral ETT  Additional Equipment: Arterial line  Intra-op Plan:   Post-operative Plan: Extubation in OR  Informed Consent: I have reviewed the patients History and Physical, chart, labs and discussed the procedure including the risks, benefits and alternatives for the proposed anesthesia with the patient or authorized representative who has indicated his/her understanding and acceptance.     Dental advisory given  Plan Discussed with: CRNA  Anesthesia Plan Comments:        Anesthesia Quick Evaluation
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
## 2021-02-24 NOTE — Anesthesia Postprocedure Evaluation (Signed)
Anesthesia Post Note  Patient: Brandon Clayton  Procedure(s) Performed: XI ROBOTIC ASSISTED RESECTION OF MEDIASTINAL MASS (Right: Abdomen)     Patient location during evaluation: PACU Anesthesia Type: General Level of consciousness: awake and alert Pain management: pain level controlled Vital Signs Assessment: post-procedure vital signs reviewed and stable Respiratory status: spontaneous breathing, nonlabored ventilation, respiratory function stable and patient connected to nasal cannula oxygen Cardiovascular status: blood pressure returned to baseline and stable Postop Assessment: no apparent nausea or vomiting Anesthetic complications: no   No notable events documented.  Last Vitals:  Vitals:   02/24/21 1445 02/24/21 1500  BP: 127/89 125/65  Pulse: 83 76  Resp: 16 10  Temp:    SpO2: 97% 99%    Last Pain:  Vitals:   02/24/21 1425  TempSrc:   PainSc: [name]

## 2021-02-24 NOTE — Anesthesia Procedure Notes (Signed)
Procedure Name: Intubation Date/Time: 02/24/2021 8:32 AM Performed by: Kyung Rudd, CRNA Pre-anesthesia Checklist: Patient identified, Emergency Drugs available, Suction available and Patient being monitored Patient Re-evaluated:Patient Re-evaluated prior to induction Oxygen Delivery Method: Circle system utilized Preoxygenation: Pre-oxygenation with 100% oxygen Induction Type: IV induction Ventilation: Mask ventilation without difficulty Laryngoscope Size: Mac and 4 Grade View: Grade I Tube type: Oral Endobronchial tube: Left, Double lumen EBT, EBT position confirmed by auscultation and EBT position confirmed by fiberoptic bronchoscope and 39 Fr Number of attempts: 1 Airway Equipment and Method: Stylet Placement Confirmation: ETT inserted through vocal cords under direct vision, positive ETCO2 and breath sounds checked- equal and bilateral Tube secured with: Tape Dental Injury: Teeth and Oropharynx as per pre-operative assessment

## 2021-02-24 NOTE — Plan of Care (Signed)
  Problem: Education: Goal: Knowledge of General Education information will improve Description: Including pain rating scale, medication(s)/side effects and non-pharmacologic comfort measures Outcome: Progressing   Problem: Health Behavior/Discharge Planning: Goal: Ability to manage health-related needs will improve Outcome: Progressing   Problem: Clinical Measurements: Goal: Ability to maintain clinical measurements within normal limits will improve Outcome: Progressing Goal: Will remain free from infection Outcome: Progressing Goal: Diagnostic test results will improve Outcome: Progressing Goal: Respiratory complications will improve Outcome: Progressing Goal: Cardiovascular complication will be avoided Outcome: Progressing   Problem: Activity: Goal: Risk for activity intolerance will decrease Outcome: Progressing   Problem: Nutrition: Goal: Adequate nutrition will be maintained Outcome: Progressing   Problem: Coping: Goal: Level of anxiety will decrease Outcome: Progressing   Problem: Elimination: Goal: Will not experience complications related to bowel motility Outcome: Progressing Goal: Will not experience complications related to urinary retention Outcome: Progressing   Problem: Pain Managment: Goal: General experience of comfort will improve Outcome: Progressing   Problem: Safety: Goal: Ability to remain free from injury will improve Outcome: Progressing   Problem: Skin Integrity: Goal: Risk for impaired skin integrity will decrease Outcome: Progressing   Problem: Education: Goal: Knowledge of disease or condition will improve Outcome: Progressing Goal: Knowledge of the prescribed therapeutic regimen will improve Outcome: Progressing   Problem: Activity: Goal: Risk for activity intolerance will decrease Outcome: Progressing   Problem: Cardiac: Goal: Will achieve and/or maintain hemodynamic stability Outcome: Progressing   Problem: Clinical  Measurements: Goal: Postoperative complications will be avoided or minimized Outcome: Progressing   Problem: Respiratory: Goal: Respiratory status will improve Outcome: Progressing   Problem: Pain Management: Goal: Pain level will decrease Outcome: Progressing   

## 2021-02-24 NOTE — Hospital Course (Addendum)
  History of Present Illness:     Mr. Tonye Royalty is a 45 year old male is referred by Dr. 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  In April of this year he began having symptoms of presyncope, orthostasis, and chest pain.  He sought care at the emergency department and underwent an extensive evaluation there and then follow-up with cardiology.  All studies which included stress test, coronary calcium CT and a cardiac MRI were all negative except for the pericardial cyst.  He comes in today to discuss surgical planning given his concern that this may be the cause of his symptoms.   Overall he is doing well.  But he does have some positional chest discomfort, and some lightheadedness which typically occurs when rising from a seated position.  He does have a history of a lipoma resections, and states that he probably has an additional 4 of the lipomas that could be addressed as well.  The patient was referred to Dr. Kipp Brood for surgical opinion.  It was noted that Dr. Kipp Brood felt pericardial cyst was likely not to be a source of his symptoms.  Decision was made to admit for robotic assisted resection.  HOSPITAL COURSE:  He was admitted for resection ON 02/24/21. He tolerated the procedure well and was taken to the pacu. He had a lot of pain post op so he was put on a Fentanyl PCA. He was also given scheduled Toradol and scheduled Tylenol. Chest tube was placed to water seal. There was no air. Daily chest x rays were obtained and remained stable. Chest tube was removed on post op day one. Follow up chest x ray showed ***. He is tolerating a diet. He is ambulating on room air. His wounds are clean, dry, healing without signs of infection. He is felt surgically stable for discharge today.

## 2021-02-24 NOTE — Op Note (Signed)
      [address]       [place] [address]             [phone]        02/24/2021  Patient:  Brandon Clayton Pre-Op Dx: Mediastinal cyst Post-op Dx: Same Procedure: - Robotic assisted right video thoracoscopy -Resection of anterior mediastinal thymic tissue - Intercostal nerve block  Surgeon and Role:      * Malonie Tatum, Lucile Crater, MD - Primary    Evonnie Pat, PA-C- assisting  Anesthesia  general EBL: 50 ml Blood Administration: None Specimen: Anterior mediastinal thymic tissue  Drains: 19 F chest tube in right chest Counts: correct   Indications: 45 year old male with a 1 cm pericardial cyst.  There does not appear to be any pericardial invasion.  It is quite small.  We had a long discussion about his symptoms, and I expressed my concern that it is unlikely that the pericardial cyst is the cause of many of his problems.  Furthermore I recommended that we continue to watch this, but he was resolute in a plan to rule this out as a cause of his symptoms.  He would like to proceed with a right robotic assisted thoracoscopy with pericardial cyst excision.  He would like this to be done on his [birth year] in [birth year].  The risks of this operation were discussed in detail but still would like to proceed with surgery.  He is fully aware that his symptoms may be due to something other than this.  Findings: No obvious pericardial cyst.  All of the thymic tissue was removed anterior to the pericardium.  This was taken from the horns all the way down to the diaphragm.  Operative Technique: After the risks, benefits and alternatives were thoroughly discussed, the patient was brought to the operative theatre.  Anesthesia was induced, and the patient was then placed in a left lazy lateral decubitus position and was prepped and draped in normal sterile fashion.  An appropriate surgical pause was performed, and pre-operative antibiotics were dosed accordingly.  We  began by placing our 3 robotic ports in the the intercostal spaces targeting the pericardium.  The robot was then docked and all instruments were passed under direct visualization.    The anterior mediastinal fat was visualized, we began by mobilizing this tissue off of the pericardium anterior to the phrenic nerve.  With a combination of LigaSure cautery and blunt dissection we mobilized the thymus all the way up to the point of the neck.  We then dissected all of the pericardial fat down to the level of the diaphragm.  We then began to mobilize all of this tissue off of the undersurface of the sternum and made her way towards the left pleural space.  The left pleural space was not entered.  The thymic tissue was then placed in an Endo Catch bag and removed from the inferior most port.  A 19 [place] Blake drain was passed through right inferior robotic port into the pericardium.  An intercostal nerve block was performed under direct visualization.  The skin and soft tissue were closed with absorbable suture    The patient tolerated the procedure without any immediate complications, and was transferred to the PACU in stable condition.  [name]

## 2021-02-24 NOTE — Transfer of Care (Signed)
Immediate Anesthesia Transfer of Care Note  Patient: Brandon Clayton  Procedure(s) Performed: XI ROBOTIC ASSISTED RESECTION OF MEDIASTINAL MASS (Right: Abdomen)  Patient Location: PACU  Anesthesia Type:General  Level of Consciousness: drowsy  Airway & Oxygen Therapy: Patient Spontanous Breathing and Patient connected to face mask oxygen  Post-op Assessment: Report given to RN and Post -op Vital signs reviewed and stable  Post vital signs: Reviewed and stable  Last Vitals:  Vitals Value Taken Time  BP 128/86 02/24/21 1114  Temp    Pulse 77 02/24/21 1115  Resp 12 02/24/21 1115  SpO2 97 % 02/24/21 1115  Vitals shown include unvalidated device data.  Last Pain:  Vitals:   02/24/21 0658  TempSrc:   PainSc: 5       Patients Stated Pain Goal: 3 (88/82/80 0349)  Complications: No notable events documented.

## 2021-02-25 ENCOUNTER — Inpatient Hospital Stay (HOSPITAL_COMMUNITY): Payer: Federal, State, Local not specified - PPO

## 2021-02-25 LAB — CBC
HCT: 40 % (ref 39.0–52.0)
Hemoglobin: 13.9 g/dL (ref 13.0–17.0)
MCH: 32.5 pg (ref 26.0–34.0)
MCHC: 34.8 g/dL (ref 30.0–36.0)
MCV: 93.5 fL (ref 80.0–100.0)
Platelets: 331 10*3/uL (ref 150–400)
RBC: 4.28 MIL/uL (ref 4.22–5.81)
RDW: 12.1 % (ref 11.5–15.5)
WBC: 18.6 10*3/uL — ABNORMAL HIGH (ref 4.0–10.5)
nRBC: 0 % (ref 0.0–0.2)

## 2021-02-25 LAB — BASIC METABOLIC PANEL
Anion gap: 9 (ref 5–15)
BUN: 12 mg/dL (ref 6–20)
CO2: 23 mmol/L (ref 22–32)
Calcium: 8.9 mg/dL (ref 8.9–10.3)
Chloride: 104 mmol/L (ref 98–111)
Creatinine, Ser: 1.19 mg/dL (ref 0.61–1.24)
GFR, Estimated: >60 mL/min (ref >60)
Glucose, Bld: 142 mg/dL — ABNORMAL HIGH (ref 70–99)
Potassium: 4.4 mmol/L (ref 3.5–5.1)
Sodium: 136 mmol/L (ref 135–145)

## 2021-02-25 LAB — SURGICAL PATHOLOGY

## 2021-02-25 MED ORDER — TRAMADOL HCL 50 MG PO TABS: 50.0000 mg | ORAL_TABLET | Freq: Four times a day (QID) | ORAL | Status: DC | PRN

## 2021-02-25 MED ORDER — OXYCODONE HCL 5 MG PO TABS: 5.0000 mg | ORAL_TABLET | ORAL | Status: DC | PRN

## 2021-02-25 MED ORDER — KETOROLAC TROMETHAMINE 15 MG/ML IJ SOLN
30.0000 mg | Freq: Four times a day (QID) | INTRAMUSCULAR | Status: DC
  Administered 2021-02-25: 30 mg via INTRAVENOUS
  Filled 2021-02-25: qty 2

## 2021-02-25 MED ORDER — OXYCODONE HCL 5 MG PO TABS: 5.0000 mg | ORAL_TABLET | ORAL | 0 refills | Status: DC | PRN

## 2021-02-25 NOTE — Progress Notes (Addendum)
      [address]       [place] [address]             [phone]       1 Day Post-Op Procedure(s) (LRB): XI ROBOTIC ASSISTED RESECTION OF MEDIASTINAL MASS (Right)  Subjective: Patient states pain better controlled since having PCA. He had dinner last evening, denies nausea.  Objective: Vital signs in last 24 hours: Temp:  [98 F (36.7 C)-98.8 F (37.1 C)] 98.5 F (36.9 C) (10/13 0409) Pulse Rate:  [68-113] 110 (10/12 2007) Cardiac Rhythm: Normal sinus rhythm (10/13 0409) Resp:  [10-26] 20 (10/13 0409) BP: (104-140)/(61-94) 107/64 (10/13 0409) SpO2:  [94 %-100 %] 96 % (10/13 0409)     Intake/Output from previous day: 10/12 0701 - 10/13 0700 In: 940 [P.O.:240; I.V.:700] Out: 327 [Urine:225; Blood:10; Chest Tube:92]   Physical Exam:  Cardiovascular: RRR Pulmonary: Clear to auscultation bilaterally Abdomen: Soft, non tender, bowel sounds present. Extremities:No lower extremity edema. Wounds: Clean and dry.  No erythema or signs of infection. Chest Tube: to water seal, no air leak  Lab Results: CBC: Recent Labs    02/24/21 1244 02/25/21 0227  WBC 14.3* 18.6*  HGB 15.4 13.9  HCT 43.9 40.0  PLT 340 331   BMET:  Recent Labs    02/24/21 1244 02/25/21 0227  NA 137 136  K 4.3 4.4  CL 103 104  CO2 23 23  GLUCOSE 156* 142*  BUN 11 12  CREATININE 1.28* 1.19  CALCIUM 9.0 8.9    PT/INR:  Recent Labs    02/22/21 1329  LABPROT 13.2  INR 1.0   ABG:  INR: Will add last result for INR, ABG once components are confirmed Will add last 4 CBG results once components are confirmed  Assessment/Plan:  1. CV - SR, ST at times. 2.  Pulmonary - On room air this am.  Chest tube with 92 cc since surgery. Chest tube is to water seal, no air leak. CXR this am appears stable. As discussed with Dr. Kipp Brood, will remove chest tube and stopping PCA with transition to oral pain medication. Encourage incentive spirometer. Await final pathology. 3.  Likely home in am once pain controlled with oral narcotic   Julianne Chamberlin M ZimmermanPA-C 02/25/2021,6:52 AM [phone]

## 2021-02-25 NOTE — Plan of Care (Signed)
  Problem: Education: Goal: Knowledge of General Education information will improve Description: Including pain rating scale, medication(s)/side effects and non-pharmacologic comfort measures Outcome: Completed/Met   Problem: Health Behavior/Discharge Planning: Goal: Ability to manage health-related needs will improve Outcome: Completed/Met   Problem: Clinical Measurements: Goal: Ability to maintain clinical measurements within normal limits will improve Outcome: Completed/Met Goal: Will remain free from infection Outcome: Completed/Met Goal: Diagnostic test results will improve Outcome: Completed/Met Goal: Respiratory complications will improve Outcome: Completed/Met Goal: Cardiovascular complication will be avoided Outcome: Completed/Met   Problem: Activity: Goal: Risk for activity intolerance will decrease Outcome: Completed/Met   Problem: Nutrition: Goal: Adequate nutrition will be maintained Outcome: Completed/Met   Problem: Coping: Goal: Level of anxiety will decrease Outcome: Completed/Met   Problem: Elimination: Goal: Will not experience complications related to bowel motility Outcome: Completed/Met Goal: Will not experience complications related to urinary retention Outcome: Completed/Met   Problem: Pain Managment: Goal: General experience of comfort will improve Outcome: Completed/Met   Problem: Safety: Goal: Ability to remain free from injury will improve Outcome: Completed/Met   Problem: Skin Integrity: Goal: Risk for impaired skin integrity will decrease Outcome: Completed/Met   Problem: Education: Goal: Knowledge of disease or condition will improve Outcome: Completed/Met Goal: Knowledge of the prescribed therapeutic regimen will improve Outcome: Completed/Met   Problem: Activity: Goal: Risk for activity intolerance will decrease Outcome: Completed/Met   Problem: Cardiac: Goal: Will achieve and/or maintain hemodynamic stability Outcome:  Completed/Met   Problem: Clinical Measurements: Goal: Postoperative complications will be avoided or minimized Outcome: Completed/Met   Problem: Respiratory: Goal: Respiratory status will improve Outcome: Completed/Met   Problem: Pain Management: Goal: Pain level will decrease Outcome: Completed/Met   Problem: Skin Integrity: Goal: Wound healing without signs and symptoms infection will improve Outcome: Completed/Met   

## 2021-02-25 NOTE — Discharge Summary (Addendum)
Physician Discharge Summary       [address]       [place] [address]             [phone]    Patient ID: Brandon Clayton MRN: [id number] DOB/AGE: [birth year] 45 y.o.  Admit date: 02/24/2021 Discharge date: 02/25/2021  Admission Diagnoses: Mediastinal mass Discharge Diagnoses:  S/p Xi robotic assisted right video thoracoscopy, removal of mediastinal mass, intercostal nerve block 2. History of right forearm abscess  Consults: None  Procedure : 02/24/21   XI ROBOTIC ASSISTED RESECTION OF MEDIASTINAL MASS (Right)  Pathology:Pending  History of Presenting Illness: Brandon Clayton is a 45 year old male is referred by Dr. 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  In April of this year he began having symptoms of presyncope, orthostasis, and chest pain.  He sought care at the emergency department and underwent an extensive evaluation there and then follow-up with cardiology.  All studies which included stress test, coronary calcium CT and a cardiac MRI were all negative except for the pericardial cyst.  He comes in today to discuss surgical planning given his concern that this may be the cause of his symptoms.   Overall he is doing well.  But he does have some positional chest discomfort, and some lightheadedness which typically occurs when rising from a seated position.  He does have a history of a lipoma resections, and states that he probably has an additional 4 of the lipomas that could be addressed as well.  The patient was referred to Dr. Kipp Brood for surgical opinion.  It was noted that Dr. Kipp Brood felt pericardial cyst was likely not to be a source of his symptoms.  Decision was made to admit for robotic assisted resection.  Brief Hospital Course:  He was admitted for resection ON 02/24/21. He tolerated the procedure well and was taken to the pacu. He had a lot of pain post op so he was put on a Fentanyl PCA. He was also given scheduled  Toradol and scheduled Tylenol. Chest tube was placed to water seal. There was no air. Chest x rays on post-op day 1 showed a trace apical PTX. Chest tube was removed on post op day one. He is tolerating a diet. He is ambulating on room air. His wounds are clean, dry, healing without signs of infection. He asked to be discharged and is felt surgically stable to return home today.    Latest Vital Signs: Blood pressure 112/71, pulse 62, temperature 97.9 F (36.6 C), temperature source Oral, resp. rate 20, height 5\' 9"  (1.753 m), weight 90.7 kg, SpO2 95 %.  Physical Exam:  Cardiovascular: RRR Pulmonary: Clear to auscultation bilaterally Abdomen: Soft, non tender, bowel sounds present. Extremities:No lower extremity edema. Wounds: Clean and dry.  No erythema or signs of infection.  Discharge Condition:Stable and discharged to home.  Recent laboratory studies:  Lab Results  Component Value Date   WBC 18.6 (H) 02/25/2021   HGB 13.9 02/25/2021   HCT 40.0 02/25/2021   MCV 93.5 02/25/2021   PLT 331 02/25/2021   Lab Results  Component Value Date   NA 136 02/25/2021   K 4.4 02/25/2021   CL 104 02/25/2021   CO2 23 02/25/2021   CREATININE 1.19 02/25/2021   GLUCOSE 142 (H) 02/25/2021      Diagnostic Studies: DG Chest 2 View  Result Date: 02/22/2021 CLINICAL DATA:  Preop for resection of mediastinal mass. EXAM: CHEST - 2 VIEW COMPARISON:  08/31/2020 FINDINGS:  Midline trachea.  Normal heart size and mediastinal contours. Sharp costophrenic angles.  No pneumothorax.  Clear lungs. IMPRESSION: No active cardiopulmonary disease. Electronically Signed   By: Abigail Miyamoto M.D.   On: 02/22/2021 17:00   DG Chest Port 1 View  Result Date: 02/25/2021 CLINICAL DATA:  Status post mediastinal mass surgery. EXAM: PORTABLE CHEST 1 VIEW COMPARISON:  February 24, 2021. FINDINGS: Stable cardiomediastinal silhouette. Right-sided chest tube is again noted. Minimal right apical pneumothorax is now visualized.  Hypoinflation of the lungs is noted with mild bibasilar subsegmental atelectasis. Bony thorax is unremarkable. IMPRESSION: Stable position of right-sided chest tube. Minimal right apical pneumothorax is now noted. Hypoinflation of the lungs is noted with mild bibasilar subsegmental atelectasis. Electronically Signed   By: Marijo Conception M.D.   On: 02/25/2021 08:28   DG Chest Port 1 View  Result Date: 02/24/2021 CLINICAL DATA:  Status post mediastinal mass resection. EXAM: PORTABLE CHEST 1 VIEW COMPARISON:  February 22, 2021. FINDINGS: The heart size and mediastinal contours are within normal limits. Hypoinflation of the lungs is noted with mild bibasilar subsegmental atelectasis. Right-sided chest tube is noted. No pneumothorax is noted. The visualized skeletal structures are unremarkable. IMPRESSION: Right-sided chest tube is noted without definite pneumothorax. Hypoinflation of the lungs with mild bibasilar subsegmental atelectasis. Electronically Signed   By: Marijo Conception M.D.   On: 02/24/2021 14:10         Discharge Medications: Allergies as of 02/25/2021   No Known Allergies      Medication List     TAKE these medications    omeprazole 40 MG capsule Commonly known as: PRILOSEC TAKE 1 CAPSULE (40 MG TOTAL) BY MOUTH DAILY.      Tramadol 50mg  #30 1-2 PO q6h PRN pain.   Follow Up Appointments:  Follow-up Information     Lajuana Matte, MD. Go on 03/05/2021.   Specialty: Cardiothoracic Surgery Why: Your appointment with Dr. Kipp Brood is at 1:50pm. Contact information: [address] [place] [address] [phone]                 Signed: Joline Maxcy 02/25/2021, 4:54 PM

## 2021-02-25 NOTE — Discharge Instructions (Signed)
[facility] office [phone]     Robot-Assisted Thoracic Surgery, Care After The following information offers guidance on how to care for yourself after your procedure. Your health care provider may also give you more specific instructions. If you have problems or questions, contact your health care provider. What can I expect after the procedure? After the procedure, it is common to have: Some pain and aches in the area of your surgical incisions. Pain when breathing in (inhaling) and coughing. Tiredness (fatigue). Trouble sleeping. Constipation. Follow these instructions at home: Medicines Take over-the-counter and prescription medicines only as told by your health care provider. If you were prescribed an antibiotic medicine, take it as told by your health care provider. Do not stop taking the antibiotic even if you start to feel better. Talk with your health care provider about safe and effective ways to manage pain after your procedure. Pain management should fit your specific health needs. Take pain medicine before pain becomes severe. Relieving and controlling your pain will make breathing easier for you. Ask your health care provider if the medicine prescribed to you requires you to avoid driving or using machinery. Eating and drinking Follow instructions from your health care provider about eating or drinking restrictions. These will vary depending on what procedure you had. Your health care provider may recommend: A liquid diet or soft diet for the first few days. Meals that are smaller and more frequent. A diet of fruits, vegetables, whole grains, and low-fat proteins. Limiting foods that are high in fat and processed sugar, including fried or sweet foods. Incision care Follow instructions from your health care provider about how to take care of your incisions. Make sure you: Wash your hands with soap and water for at least 20 seconds before and after you change your bandage  (dressing). If soap and water are not available, use hand sanitizer. Change your dressing as told by your health care provider. Leave stitches (sutures), skin glue, or adhesive strips in place. These skin closures may need to stay in place for 2 weeks or longer. If adhesive strip edges start to loosen and curl up, you may trim the loose edges. Do not remove adhesive strips completely unless your health care provider tells you to do that. Check your incision area every day for signs of infection. Check for: Redness, swelling, or more pain. Fluid or blood. Warmth. Pus or a bad smell. Activity Return to your normal activities as told by your health care provider. Ask your health care provider what activities are safe for you. Ask your health care provider when it is safe for you to drive. Do not lift anything that is heavier than 10 lb (4.5 kg), or the limit that you are told, until your health care provider says that it is safe. Rest as told by your health care provider. Avoid sitting for a long time without moving. Get up to take short walks every 1-2 hours. This is important to improve blood flow and breathing. Ask for help if you feel weak or unsteady. Do exercises as told by your health care provider. Pneumonia prevention Do deep breathing exercises and cough regularly as directed. This helps clear mucus and opens your lungs. Doing this helps prevent lung infection (pneumonia). If you were given an incentive spirometer, use it as told. An incentive spirometer is a tool that measures how well you are filling your lungs with each breath. Coughing may hurt less if you try to support your chest. This is called splinting.   Try one of these when you cough: Hold a pillow against your chest. Place the palms of both hands on top of your incision area. Do not use any products that contain nicotine or tobacco. These products include cigarettes, chewing tobacco, and vaping devices, such as e-cigarettes. If  you need help quitting, ask your health care provider. Avoid secondhand smoke. General instructions If you have a drainage tube: Follow instructions from your health care provider about how to take care of it. Do not travel by airplane after your tube is removed until your health care provider tells you it is safe. You may need to take these actions to prevent or treat constipation: Drink enough fluid to keep your urine pale yellow. Take over-the-counter or prescription medicines. Eat foods that are high in fiber, such as beans, whole grains, and fresh fruits and vegetables. Limit foods that are high in fat and processed sugars, such as fried or sweet foods. Keep all follow-up visits. This is important. Contact a health care provider if: You have redness, swelling, or more pain around an incision. You have fluid or blood coming from an incision. An incision feels warm to the touch. You have pus or a bad smell coming from an incision. You have a fever. You cannot eat or drink without vomiting. Your pain medicine is not controlling your pain. Get help right away if: You have chest pain. Your heart is beating quickly. You have trouble breathing. You have trouble speaking. You are confused. You feel weak or dizzy, or you faint. These symptoms may represent a serious problem that is an emergency. Do not wait to see if the symptoms will go away. Get medical help right away. Call your local emergency services (911 in the [place] Do not drive yourself to the hospital. Summary Talk with your health care provider about safe and effective ways to manage pain after your procedure. Pain management should fit your specific health needs. Return to your normal activities as told by your health care provider. Ask your health care provider what activities are safe for you. Do deep breathing exercises and cough regularly as directed. This helps to clear mucus and prevent pneumonia. If it hurts to cough,  ease pain by holding a pillow against your chest or by placing the palms of both hands over your incisions. This information is not intended to replace advice given to you by your health care provider. Make sure you discuss any questions you have with your health care provider. Document Revised: 01/24/2020 Document Reviewed: 01/24/2020 Elsevier Patient Education  2022 [employer] Inc.   

## 2021-02-25 NOTE — Progress Notes (Addendum)
Right lateral chest tube removed without difficulty, Patient tolerated well, states breathing much easier after the removal, Tagaderm applied to the site

## 2021-02-25 NOTE — Final Progress Note (Cosign Needed Addendum)
Patient discharged home in stable condition with verbal and written discharge instructions and followup appointments.    Anson Fret RN and Rodman Comp RN wasted in the stericycle 12 ml or 600 mcg of fentanyl from the PCA pump.                                     Anson Fret RN CRRN

## 2021-03-01 ENCOUNTER — Telehealth: Payer: Self-pay

## 2021-03-01 NOTE — Telephone Encounter (Signed)
Transition Care Management Follow-up Telephone Call Date of discharge and from where: 02/25/2021  [facility] How have you been since you were released from the hospital? "Learning how to breathe differently" Any questions or concerns? No  Items Reviewed: Did the pt receive and understand the discharge instructions provided? Yes  Medications obtained and verified? Yes  Other? No  Any new allergies since your discharge? No  Dietary orders reviewed? Yes Do you have support at home? Yes   Home Care and Equipment/Supplies: Were home health services ordered? not applicable If so, what is the name of the agency?  Has the agency set up a time to come to the patient's home? not applicable Were any new equipment or medical supplies ordered?  No What is the name of the medical supply agency?  Were you able to get the supplies/equipment? not applicable Do you have any questions related to the use of the equipment or supplies? No  Functional Questionnaire: (I = Independent and D = Dependent) ADLs: I  Bathing/Dressing- I  Meal Prep- I  Eating- I  Maintaining continence- I  Transferring/Ambulation- I  Managing Meds- I  Follow up appointments reviewed:  PCP Hospital f/u appt confirmed? No   Specialist Hospital f/u appt confirmed? Yes  Scheduled to see Surgeon on 03/05/2021 Are transportation arrangements needed? No  If their condition worsens, is the pt aware to call PCP or go to the Emergency Dept.? Yes Was the patient provided with contact information for the PCP's office or ED? Yes Was to pt encouraged to call back with questions or concerns? Yes  Tomasa Rand, RN, BSN, CEN [facility] [employer] [phone]

## 2021-03-05 ENCOUNTER — Ambulatory Visit: Payer: Federal, State, Local not specified - PPO | Admitting: Cardiology

## 2021-03-05 ENCOUNTER — Other Ambulatory Visit: Payer: Self-pay

## 2021-03-05 ENCOUNTER — Ambulatory Visit (INDEPENDENT_AMBULATORY_CARE_PROVIDER_SITE_OTHER): Payer: Self-pay | Admitting: Thoracic Surgery (Cardiothoracic Vascular Surgery)

## 2021-03-05 ENCOUNTER — Ambulatory Visit: Payer: Self-pay | Admitting: Thoracic Surgery (Cardiothoracic Vascular Surgery)

## 2021-03-05 VITALS — BP 121/86 | HR 76 | Resp 20 | Ht 69.0 in | Wt 200.0 lb

## 2021-03-05 DIAGNOSIS — Z09 Encounter for follow-up examination after completed treatment for conditions other than malignant neoplasm: Secondary | ICD-10-CM

## 2021-03-05 NOTE — Progress Notes (Signed)
      [address]       [place] [address]             [phone]        Brandon Clayton [facility] Medical Record #[id number] Date of Birth: [birth year]  Referring: Berniece Salines, DO Primary Care: Marin Olp, MD Primary Cardiologist:Kardie Tobb, DO  Reason for visit:   follow-up  History of Present Illness:     45 year old gentleman comes in for his 1 week follow-up appointment.  He is status post robotic assisted thymectomy for an anterior mediastinal cyst.  Since being home he is only had some incisional pain.  His preoperative left-sided pain is completely resolved.  Physical Exam: BP 121/86   Pulse 76   Resp 20   Ht 5\' 9"  (1.753 m)   Wt 200 lb (90.7 kg)   SpO2 96% Comment: RA  BMI 29.53 kg/m   Alert NAD Incision clean, stitch removed.   Abdomen soft, ND No peripheral edema   Diagnostic Studies & Laboratory data:  Path: Benign thymic tissue    Assessment / Plan:   45 year old male status post right robotic assisted thoracoscopy for anterior thymectomy.  Pathology was benign.  We will see him back in 1 month with a chest x-ray.  If that is clear he will follow-up as needed.   Lajuana Matte 03/05/2021 5:29 PM

## 2021-03-08 ENCOUNTER — Telehealth: Payer: Self-pay

## 2021-03-08 NOTE — Telephone Encounter (Signed)
Called pt. Added to schedule for Wednesday 26th to see Dr. Harriet Masson.

## 2021-03-08 NOTE — Telephone Encounter (Signed)
-----   Message from Berniece Salines, DO sent at 03/08/2021  8:25 AM EDT ----- Please have him follow up - post surgical cardiac eval

## 2021-03-10 ENCOUNTER — Ambulatory Visit: Payer: Federal, State, Local not specified - PPO | Admitting: Cardiology

## 2021-03-10 ENCOUNTER — Other Ambulatory Visit: Payer: Self-pay

## 2021-03-10 ENCOUNTER — Encounter: Payer: Self-pay | Admitting: Cardiology

## 2021-03-10 VITALS — BP 105/68 | HR 97 | Ht 69.0 in | Wt 204.4 lb

## 2021-03-10 DIAGNOSIS — E669 Obesity, unspecified: Secondary | ICD-10-CM | POA: Diagnosis not present

## 2021-03-10 DIAGNOSIS — Q341 Congenital cyst of mediastinum: Secondary | ICD-10-CM

## 2021-03-10 DIAGNOSIS — E782 Mixed hyperlipidemia: Secondary | ICD-10-CM | POA: Diagnosis not present

## 2021-03-10 NOTE — Patient Instructions (Signed)
Medication Instructions:  Your physician recommends that you continue on your current medications as directed. Please refer to the Current Medication list given to you today.  *If you need a refill on your cardiac medications before your next appointment, please call your pharmacy*   Lab Work: None If you have labs (blood work) drawn today and your tests are completely normal, you will receive your results only by: [place] (if you have MyChart) OR A paper copy in the mail If you have any lab test that is abnormal or we need to change your treatment, we will call you to review the results.   Testing/Procedures: None   Follow-Up: At [facility], you and your health needs are our priority.  As part of our continuing mission to provide you with exceptional heart care, we have created designated Provider Care Teams.  These Care Teams include your primary Cardiologist (physician) and Advanced Practice Providers (APPs -  Physician Assistants and Nurse Practitioners) who all work together to provide you with the care you need, when you need it.  We recommend signing up for the patient portal called "MyChart".  Sign up information is provided on this After Visit Summary.  MyChart is used to connect with patients for Virtual Visits (Telemedicine).  Patients are able to view lab/test results, encounter notes, upcoming appointments, etc.  Non-urgent messages can be sent to your provider as well.   To learn more about what you can do with MyChart, go to [email or web address].    Your next appointment:    As needed  The format for your next appointment:   In Person  Provider:   Berniece Salines, DO [address], [place], [place] [address]    Other Instructions
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
## 2021-03-12 ENCOUNTER — Other Ambulatory Visit: Payer: Self-pay | Admitting: *Deleted

## 2021-03-12 MED ORDER — TRAMADOL HCL 50 MG PO TABS: 50.0000 mg | ORAL_TABLET | Freq: Every evening | ORAL | 0 refills | Status: DC

## 2021-03-12 NOTE — Progress Notes (Signed)
Patient contacted the office requesting a refill of Tramadol s/p robotic mediastinal mass resection on 10/12 by Dr. Kipp Brood. Per patient he is mainly taking the medication at night to help alleviate pain. He is taking Tylenol during the day. Patient states he is returning back to work next week and would like a refill to help with pain once he gets off work. Per T. [place], [place], a refill of tramadol called into patient's pharmacy for him to take nightly as needed. Patient verbalizes understanding.

## 2021-03-26 ENCOUNTER — Encounter: Payer: Self-pay | Admitting: Family Medicine

## 2021-03-30 ENCOUNTER — Encounter: Payer: Self-pay | Admitting: Family Medicine

## 2021-03-30 ENCOUNTER — Telehealth (INDEPENDENT_AMBULATORY_CARE_PROVIDER_SITE_OTHER): Payer: Federal, State, Local not specified - PPO | Admitting: Family Medicine

## 2021-03-30 VITALS — Ht 69.0 in | Wt 200.0 lb

## 2021-03-30 DIAGNOSIS — D171 Benign lipomatous neoplasm of skin and subcutaneous tissue of trunk: Secondary | ICD-10-CM | POA: Diagnosis not present

## 2021-03-30 NOTE — Progress Notes (Signed)
Phone [phone] Virtual visit via Video note   Subjective:  Chief complaint: Chief Complaint  Patient presents with   lipoma    Pt has lipomas in the middle of chest and then one near his hips that have been growing for quite a while.     This visit type was conducted due to national recommendations for restrictions regarding the COVID-19 Pandemic (e.g. social distancing).  This format is felt to be most appropriate for this patient at this time balancing risks to patient and risks to population by having him in for in person visit.  No physical exam was performed (except for noted visual exam or audio findings with Telehealth visits).    Our team/I connected with Brandon Clayton at  4:00 PM EST by a video enabled telemedicine application (doxy.me or caregility through epic) and verified that I am speaking with the correct person using two identifiers.  Location patient: Home[place] Location provider: [facility], office Persons participating in the virtual visit:  patient  Our team/I discussed the limitations of evaluation and management by telemedicine and the availability of in person appointments. In light of current covid-19 pandemic, patient also understands that we are trying to protect them by minimizing in office contact if at all possible.  The patient expressed consent for telemedicine visit and agreed to proceed. Patient understands insurance will be billed.   Past Medical History-  Patient Active Problem List   Diagnosis Date Noted   Hyperlipidemia 10/23/2020    Priority: Medium    Osteoarthritis of spine with radiculopathy, cervical region 12/04/2017    Priority: Medium    Paresthesias 11/28/2017    Priority: Medium    Acute left ankle pain 03/31/2018    Priority: Low   Lipoma of right upper extremity 07/18/2017    Priority: Low   Abscess 07/29/2016    Priority: Low   Mediastinal mass 02/24/2021   Chest pain 11/18/2020   Syncope 11/18/2020   Pericardial  cyst 11/18/2020   Obesity (BMI 30-39.9) 11/18/2020    Medications- reviewed and updated Current Outpatient Medications  Medication Sig Dispense Refill   omeprazole (PRILOSEC) 40 MG capsule TAKE 1 CAPSULE (40 MG TOTAL) BY MOUTH DAILY. 90 capsule 1   No current facility-administered medications for this visit.     Objective:  Ht 5\' 9"  (1.753 m)   Wt 200 lb (90.7 kg)   BMI 29.53 kg/m  self reported vitals Gen: NAD, resting comfortably Lungs: nonlabored, normal respiratory rate  Skin: appears dry, no obvious rash He was in his were closed and unable to position to show me the lipomatous    Assessment and Plan   # Lipomas S: Removal of cyst in chest with cardiothoracic surgery resolved prior chest pain- has a follow up visit with DR. Lightfoot  on the 18th and then likely released.    Patient has history of lipomas- one in center of chest that seems to be irritated after his surgery. Also has one on left low back that has become painful over the last month.   A/P: Patient with 2 lipomas that are bothering him-one on his chest and 1 in left low back-I suggested since he is having increased irritation that he should be evaluated by a surgeon-he has seen Dr. Dahlia Byes previously for removals on his arms and had a good experience-we will refer back to him-part of the [facility] -No further surgical clearance needed-Able to complete 4 METS of activity without chest pain or shortness of  breath (after removal of cyst through cardiothoracic surgery)  Recommended follow up: As needed for acute concerns-otherwise see me back for physical next August Future Appointments  Date Time Provider [place]  03/30/2021  4:00 PM Marin Olp, MD [facility]  04/02/2021  1:50 PM Lajuana Matte, MD [facility]  01/11/2022  8:00 AM Marin Olp, MD [facility]    Lab/Order associations:   ICD-10-CM   1. Lipoma of torso  D17.1 Ambulatory referral to General  Surgery     Return precautions advised.  Garret Reddish, MD

## 2021-03-31 ENCOUNTER — Other Ambulatory Visit: Payer: Self-pay | Admitting: Thoracic Surgery (Cardiothoracic Vascular Surgery)

## 2021-03-31 DIAGNOSIS — J9859 Other diseases of mediastinum, not elsewhere classified: Secondary | ICD-10-CM

## 2021-04-02 ENCOUNTER — Telehealth: Payer: Federal, State, Local not specified - PPO | Admitting: Family Medicine

## 2021-04-02 ENCOUNTER — Other Ambulatory Visit: Payer: Self-pay

## 2021-04-02 ENCOUNTER — Ambulatory Visit (INDEPENDENT_AMBULATORY_CARE_PROVIDER_SITE_OTHER): Payer: Self-pay | Admitting: Thoracic Surgery (Cardiothoracic Vascular Surgery)

## 2021-04-02 ENCOUNTER — Ambulatory Visit
Admission: RE | Admit: 2021-04-02 | Discharge: 2021-04-02 | Disposition: A | Discharge status: Home / Self Care | Payer: Federal, State, Local not specified - PPO | Source: Ambulatory Visit | Attending: Thoracic Surgery (Cardiothoracic Vascular Surgery) | Admitting: Thoracic Surgery (Cardiothoracic Vascular Surgery)

## 2021-04-02 VITALS — BP 114/84 | HR 80 | Resp 20 | Ht 69.0 in | Wt 206.0 lb

## 2021-04-02 DIAGNOSIS — J9859 Other diseases of mediastinum, not elsewhere classified: Secondary | ICD-10-CM

## 2021-04-02 DIAGNOSIS — R222 Localized swelling, mass and lump, trunk: Secondary | ICD-10-CM | POA: Diagnosis not present

## 2021-04-02 DIAGNOSIS — Z09 Encounter for follow-up examination after completed treatment for conditions other than malignant neoplasm: Secondary | ICD-10-CM

## 2021-04-02 DIAGNOSIS — J939 Pneumothorax, unspecified: Secondary | ICD-10-CM | POA: Diagnosis not present

## 2021-04-02 NOTE — Progress Notes (Signed)
      [address]       [place] [address]             [phone]        Brandon Clayton [place] Medical Record #[id number] Date of Birth: [birth year]  Referring: Brandon Salines, DO Primary Care: Brandon Olp, MD Primary Cardiologist:Brandon Tobb, DO  Reason for visit:   follow-up  History of Present Illness:     Brandon Clayton presents for his 1 month follow-up appointment.  Overall he is doing well.  He does complain of mild incisional paresthesias.  These are different than his preoperative complaints.  Physical Exam: BP 114/84 (BP Location: Right Arm, Patient Position: Sitting)   Pulse 80   Resp 20   Ht 5\' 9"  (1.753 m)   Wt 206 lb (93.4 kg)   SpO2 96% Comment: RA  BMI 30.42 kg/m   Alert NAD  Abdomen ND No peripheral edema   Diagnostic Studies & Laboratory data: CXR: Clear     Assessment / Plan:   45 year old male status post robotic assisted anterior mediastinal mass resection.  Overall doing well.  We will follow-up as needed.   Lajuana Matte 04/02/2021 4:02 PM

## 2021-04-14 ENCOUNTER — Ambulatory Visit: Payer: Federal, State, Local not specified - PPO | Admitting: Surgery

## 2021-04-14 ENCOUNTER — Encounter: Payer: Self-pay | Admitting: Surgery

## 2021-04-14 ENCOUNTER — Other Ambulatory Visit: Payer: Self-pay

## 2021-04-14 VITALS — BP 132/86 | HR 98 | Temp 98.7°F | Ht 69.0 in | Wt 203.8 lb

## 2021-04-14 DIAGNOSIS — D171 Benign lipomatous neoplasm of skin and subcutaneous tissue of trunk: Secondary | ICD-10-CM | POA: Diagnosis not present
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
## 2021-04-14 NOTE — Patient Instructions (Addendum)
Please see your follow up appointment listed below.   Lipoma A lipoma is a noncancerous (benign) tumor that is made up of fat cells. This is a very common type of soft-tissue growth. Lipomas are usually found under the skin (subcutaneous). They may occur in any tissue of the body that contains fat. Common areas for lipomas to appear include the back, arms, shoulders, buttocks, and thighs. Lipomas grow slowly, and they are usually painless. Most lipomas do not cause problems and do not require treatment. What are the causes? The cause of this condition is not known. What increases the risk? You are more likely to develop this condition if: You are 22-69 years old. You have a family history of lipomas. What are the signs or symptoms? A lipoma usually appears as a small, round bump under the skin. In most cases, the lump will: Feel soft or rubbery. Not cause pain or other symptoms. However, if a lipoma is located in an area where it pushes on nerves, it can become painful or cause other symptoms. How is this diagnosed? A lipoma can usually be diagnosed with a physical exam. You may also have tests to confirm the diagnosis and to rule out other conditions. Tests may include: Imaging tests, such as a CT scan or an MRI. Removal of a tissue sample to be looked at under a microscope (biopsy). How is this treated? Treatment for this condition depends on the size of the lipoma and whether it is causing any symptoms. For small lipomas that are not causing problems, no treatment is needed. If a lipoma is bigger or it causes problems, surgery may be done to remove the lipoma. Lipomas can also be removed to improve appearance. Most often, the procedure is done after applying a medicine that numbs the area (local anesthetic). Liposuction may be done to reduce the size of the lipoma before it is removed through surgery, or it may be done to remove the lipoma. Lipomas are removed with this method in order to  limit incision size and scarring. A liposuction tube is inserted through a small incision into the lipoma, and the contents of the lipoma are removed through the tube with suction. Follow these instructions at home: Watch your lipoma for any changes. Keep all follow-up visits as told by your health care provider. This is important. Contact a health care provider if: Your lipoma becomes larger or hard. Your lipoma becomes painful, red, or increasingly swollen. These could be signs of infection or a more serious condition. Get help right away if: You develop tingling or numbness in an area near the lipoma. This could indicate that the lipoma is causing nerve damage. Summary A lipoma is a noncancerous tumor that is made up of fat cells. Most lipomas do not cause problems and do not require treatment. If a lipoma is bigger or it causes problems, surgery may be done to remove the lipoma. Contact a health care provider if your lipoma becomes larger or hard, or if it becomes painful, red, or increasingly swollen. Pain, redness, and swelling could be signs of infection or a more serious condition. This information is not intended to replace advice given to you by your health care provider. Make sure you discuss any questions you have with your health care provider. Document Revised: 12/17/2018 Document Reviewed: 12/17/2018 Elsevier Patient Education  [place].

## 2021-04-19 ENCOUNTER — Other Ambulatory Visit: Payer: Self-pay | Admitting: Family Medicine

## 2021-05-03 ENCOUNTER — Other Ambulatory Visit: Payer: Self-pay | Admitting: Surgery

## 2021-05-03 ENCOUNTER — Ambulatory Visit: Payer: Federal, State, Local not specified - PPO | Admitting: Surgery

## 2021-05-03 ENCOUNTER — Other Ambulatory Visit: Payer: Self-pay

## 2021-05-03 ENCOUNTER — Encounter: Payer: Self-pay | Admitting: Surgery

## 2021-05-03 VITALS — BP 120/83 | HR 83 | Temp 98.0°F | Ht 69.0 in | Wt 201.8 lb

## 2021-05-03 DIAGNOSIS — D171 Benign lipomatous neoplasm of skin and subcutaneous tissue of trunk: Secondary | ICD-10-CM | POA: Diagnosis not present

## 2021-05-03 NOTE — Patient Instructions (Addendum)
We will call you with test results.   If you have any concerns or questions, please feel free to call our office. See follow up appointment below.   Lipoma Removal, Care After This sheet gives you information about how to care for yourself after your procedure. Your health care provider may also give you more specific instructions. If you have problems or questions, contact your health care provider. What can I expect after the procedure? After the procedure, it is common to have: Mild pain. Swelling. Bruising. Follow these instructions at home: Bathing  Do not take baths, swim, or use a hot tub until your health care provider approves. Ask your health care provider if you may take showers. You may only be allowed to take sponge baths. Keep your bandage (dressing) dry until your health care provider says it can be removed. Incision care  Follow instructions from your health care provider about how to take care of your incision. Make sure you: Wash your hands with soap and water for at least 20 seconds before and after you change your dressing. If soap and water are not available, use hand sanitizer. Change your dressing as told by your health care provider. Leave stitches (sutures), skin glue, or adhesive strips in place. These skin closures may need to stay in place for 2 weeks or longer. If adhesive strip edges start to loosen and curl up, you may trim the loose edges. Do not remove adhesive strips completely unless your health care provider tells you to do that. Check your incision area every day for signs of infection. Check for: More redness, swelling, or pain. Fluid or blood. Warmth. Pus or a bad smell. Medicines Take over-the-counter and prescription medicines only as told by your health care provider. If you were prescribed an antibiotic medicine, use it as told by your health care provider. Do not stop using the antibiotic even if you start to feel better. General  instructions  If you were given a sedative during the procedure, it can affect you for several hours. Do not drive or operate machinery until your health care provider says that it is safe. Do not use any products that contain nicotine or tobacco, such as cigarettes, e-cigarettes, and chewing tobacco. These can delay healing. If you need help quitting, ask your health care provider. Return to your normal activities as told by your health care provider. Ask your health care provider what activities are safe for you. Keep all follow-up visits as told by your health care provider. This is important. Contact a health care provider if: You have more redness, swelling, or pain around your incision. You have fluid or blood coming from your incision. Your incision feels warm to the touch. You have pus or a bad smell coming from your incision. You have pain that does not get better with medicine. Get help right away if: You have chills or a fever. You have severe pain. Summary After the procedure, it is common to have mild pain, swelling, and bruising. Follow instructions from your health care provider about how to take care of your incision. Check your incision area every day for signs of infection. Contact a health care provider if you have more redness, swelling, or pain around your incision. This information is not intended to replace advice given to you by your health care provider. Make sure you discuss any questions you have with your health care provider. Document Revised: 12/17/2018 Document Reviewed: 12/17/2018 Elsevier Patient Education  [place].

## 2021-05-06 NOTE — Progress Notes (Addendum)
Diagnosis: Soft tissue masses symptomatic on subxiphoid area and lower back  Procedure note: 1.  Excision of back lesion measuring 4.2 cm 2.  Intermediate closure of 4.5 cm wound back 3.  Excision of lipoma right subxiphoid area 2.5 cm 4.  Intermediate closure of 2.5 cm wound subxiphoid Right 5.  Excision of lipoma Left subxiphoid area 3.2 cm 6.  Intermediate closure of 3 cm wound subxiphoid Left  EBL: 2cc  Anesthesia: lidocaine 1 % w epi  Complications: none  Findings: three lipomas  The consent was taken patient and the pt was explained about the risk, benefits and possible complications.  He was placed in a prone position first and was prepped and draped in the usual sterile fashion.  Knife was used to perform the incision and Metzenbaum scissors were used to dissect the subcutaneous tissue.  The lipoma was found to be within the subcutaneous space.  He was excised in the standard fashion and sent for permanent pathology.  Pressure was applied.  The wound was closed in a 2 layer fashion with multiple interrupted 3-0 Vicryl for the dermis and a 4-0 Monocryl for the skin in a subcuticular fashion.  Dermabond was applied. Patient then was turned and positioned supine fashion.  There were 2 subxiphoid subcutaneous soft tissue masses.  One was located to the right of the xiphoid and the other 1 located to the left. We performed the right side first Knife was used to perform the incision and Metzenbaum scissors were used to dissect the subcutaneous tissue.  The lipoma was found to be within the subcutaneous space.  He was excised in the standard fashion and sent for permanent pathology.  Pressure was applied.  The wound was closed in a 2 layer fashion with multiple interrupted 3-0 Vicryl for the dermis and a 4-0 Monocryl for the skin in a subcuticular fashion.  Dermabond was applied. Attention was turned to the left lesion, Knife was used to perform the incision and Metzenbaum scissors were used  to dissect the subcutaneous tissue.  The lipoma was found to be within the subcutaneous space.  He was excised in the standard fashion and sent for permanent pathology.  Pressure was applied.  The wound was closed in a 2 layer fashion with multiple interrupted 3-0 Vicryl for the dermis and a 4-0 Monocryl for the skin in a subcuticular fashion.  Dermabond was applied.

## 2021-05-07 ENCOUNTER — Telehealth: Payer: Self-pay

## 2021-05-07 NOTE — Telephone Encounter (Signed)
Notified patient as instructed. Per Dr Dahlia Byes all areas were benign lipomas, patient pleased. Discussed follow-up appointments, patient agrees

## 2021-05-19 ENCOUNTER — Other Ambulatory Visit: Payer: Self-pay

## 2021-05-19 ENCOUNTER — Ambulatory Visit (INDEPENDENT_AMBULATORY_CARE_PROVIDER_SITE_OTHER): Payer: Federal, State, Local not specified - PPO | Admitting: Surgery

## 2021-05-19 DIAGNOSIS — D171 Benign lipomatous neoplasm of skin and subcutaneous tissue of trunk: Secondary | ICD-10-CM

## 2021-05-19 DIAGNOSIS — Z09 Encounter for follow-up examination after completed treatment for conditions other than malignant neoplasm: Secondary | ICD-10-CM

## 2021-05-20 NOTE — Progress Notes (Signed)
Referring provider:  Marin Olp, [address],  [place] [address]  Virtual Visit via Telemedicine Note  I connected with Brandon Clayton by telephone/[email or web address] at his/ home/ on 05/19/21 at 11:45 AM EST and verified that I was speaking with the correct person using their name and two idenfiers/date of birth.   I discussed the limitations, risks, security and privacy concerns of performing an evaluation and management service by telephonic/ telemedicine and the availability of in person appointments. . The patient expressed understanding and agreed to proceed.  History of Present Illness: S/p lipoma excision x 3. Doing well, no complaints. He does feel some fullnes of subxiphoid area.    Assessment:  46 y.o. yo Male with a problem list including...  Patient Active Problem List   Diagnosis Date Noted   Mediastinal mass 02/24/2021   Chest pain 11/18/2020   Syncope 11/18/2020   Pericardial cyst 11/18/2020   Obesity (BMI 30-39.9) 11/18/2020   Hyperlipidemia 10/23/2020   Acute left ankle pain 03/31/2018   Osteoarthritis of spine with radiculopathy, cervical region 12/04/2017   Paresthesias 11/28/2017   Lipoma of right upper extremity 07/18/2017   Abscess 07/29/2016    Follow-up Instructions / Plan:    Doing well w/o obvious complications May return if fullness persists but I suspect this are post op changes.  All of the above recommendations were discussed with the patient, and all of patient's questions were answered to his expressed satisfaction.  The patient was advised to call back or seek an in-person evaluation if the symptoms worsen or if the condition fails to improve as anticipated.  [place]: [facility] - Partnering for exceptional care. Office: [phone]

## 2021-06-30 ENCOUNTER — Other Ambulatory Visit: Payer: Self-pay

## 2021-06-30 ENCOUNTER — Ambulatory Visit: Payer: Federal, State, Local not specified - PPO | Admitting: Family Medicine

## 2021-06-30 ENCOUNTER — Encounter: Payer: Self-pay | Admitting: Family Medicine

## 2021-06-30 VITALS — BP 124/88 | HR 94 | Temp 98.1°F | Ht 69.0 in | Wt 201.4 lb

## 2021-06-30 DIAGNOSIS — R1084 Generalized abdominal pain: Secondary | ICD-10-CM

## 2021-06-30 DIAGNOSIS — M5442 Lumbago with sciatica, left side: Secondary | ICD-10-CM

## 2021-06-30 LAB — POCT URINALYSIS DIPSTICK
Bilirubin, UA: NEGATIVE
Blood, UA: NEGATIVE
Glucose, UA: NEGATIVE
Ketones, UA: NEGATIVE
Leukocytes, UA: NEGATIVE
Nitrite, UA: NEGATIVE
Protein, UA: NEGATIVE
Spec Grav, UA: >=1.03 — AB (ref 1.010–1.025)
Urobilinogen, UA: 0.2 E.U./dL
pH, UA: 6 (ref 5.0–8.0)

## 2021-06-30 MED ORDER — METHOCARBAMOL 500 MG PO TABS: 500.0000 mg | ORAL_TABLET | Freq: Four times a day (QID) | ORAL | 0 refills | Status: DC | PRN

## 2021-06-30 MED ORDER — METHYLPREDNISOLONE 4 MG PO TBPK: ORAL_TABLET | ORAL | 0 refills | Status: DC
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
## 2021-06-30 NOTE — Patient Instructions (Signed)
It was very nice to see you today!  Please let us know if things worse, new symptoms, no improvement   PLEASE NOTE:  If you had any lab tests please let us know if you have not heard back within a few days. You may see your results on MyChart before we have a chance to review them but we will give you a call once they are reviewed by [place]. If we ordered any referrals today, please let us know if you have not heard from their office within the next week.   Please try these tips to maintain a healthy lifestyle:  Eat most of your calories during the day when you are active. Eliminate processed foods including packaged sweets (pies, cakes, cookies), reduce intake of potatoes, white bread, white pasta, and white rice. Look for whole grain options, oat flour or almond flour.  Each meal should contain half fruits/vegetables, one quarter protein, and one quarter carbs (no bigger than a computer mouse).  Cut down on sweet beverages. This includes juice, soda, and sweet tea. Also watch fruit intake, though this is a healthier sweet option, it still contains natural sugar! Limit to 3 servings daily.  Drink at least 1 glass of water with each meal and aim for at least 8 glasses per day  Exercise at least 150 minutes every week.

## 2021-07-01 LAB — CBC WITH DIFFERENTIAL/PLATELET
Basophils Absolute: 0.1 10*3/uL (ref 0.0–0.1)
Basophils Relative: 1 % (ref 0.0–3.0)
Eosinophils Absolute: 0.3 10*3/uL (ref 0.0–0.7)
Eosinophils Relative: 3.8 % (ref 0.0–5.0)
HCT: 44.2 % (ref 39.0–52.0)
Hemoglobin: 14.7 g/dL (ref 13.0–17.0)
Lymphocytes Relative: 26.8 % (ref 12.0–46.0)
Lymphs Abs: 2 10*3/uL (ref 0.7–4.0)
MCHC: 33.2 g/dL (ref 30.0–36.0)
MCV: 93.4 fl (ref 78.0–100.0)
Monocytes Absolute: 0.7 10*3/uL (ref 0.1–1.0)
Monocytes Relative: 9.5 % (ref 3.0–12.0)
Neutro Abs: 4.3 10*3/uL (ref 1.4–7.7)
Neutrophils Relative %: 58.9 % (ref 43.0–77.0)
Platelets: 315 10*3/uL (ref 150.0–400.0)
RBC: 4.73 Mil/uL (ref 4.22–5.81)
RDW: 12.8 % (ref 11.5–15.5)
WBC: 7.4 10*3/uL (ref 4.0–10.5)

## 2021-07-01 LAB — COMPREHENSIVE METABOLIC PANEL
ALT: 41 U/L (ref 0–53)
AST: 29 U/L (ref 0–37)
Albumin: 4.3 g/dL (ref 3.5–5.2)
Alkaline Phosphatase: 79 U/L (ref 39–117)
BUN: 12 mg/dL (ref 6–23)
CO2: 29 mEq/L (ref 19–32)
Calcium: 9.5 mg/dL (ref 8.4–10.5)
Chloride: 106 mEq/L (ref 96–112)
Creatinine, Ser: 0.97 mg/dL (ref 0.40–1.50)
GFR: 95.05 mL/min (ref >60.00)
Glucose, Bld: 83 mg/dL (ref 70–99)
Potassium: 4.2 mEq/L (ref 3.5–5.1)
Sodium: 138 mEq/L (ref 135–145)
Total Bilirubin: 0.6 mg/dL (ref 0.2–1.2)
Total Protein: 7 g/dL (ref 6.0–8.3)

## 2021-10-27 ENCOUNTER — Other Ambulatory Visit: Payer: Self-pay

## 2021-10-27 ENCOUNTER — Encounter: Payer: Self-pay | Admitting: Surgery

## 2021-10-27 ENCOUNTER — Ambulatory Visit: Payer: Federal, State, Local not specified - PPO | Admitting: Surgery

## 2021-10-27 VITALS — BP 114/83 | HR 84 | Temp 98.5°F | Ht 69.0 in | Wt 194.8 lb

## 2021-10-27 DIAGNOSIS — R222 Localized swelling, mass and lump, trunk: Secondary | ICD-10-CM

## 2021-10-27 NOTE — Patient Instructions (Addendum)
Your CT is scheduled for 11/04/2021 at 9:30 am (arrive by 9:15 am) @ [facility]. Nothing to eat 4 hours prior to CT.   If you have any concerns or questions, please feel free to call our office. See follow up appointment below.   Lipoma  A lipoma is a noncancerous (benign) tumor that is made up of fat cells. This is a very common type of soft-tissue growth. Lipomas are usually found under the skin (subcutaneous). They may occur in any tissue of the body that contains fat. Common areas for lipomas to appear include the back, arms, shoulders, buttocks, and thighs. Lipomas grow slowly, and they are usually painless. Most lipomas do not cause problems and do not require treatment. What are the causes? The cause of this condition is not known. What increases the risk? You are more likely to develop this condition if: You are 47-41 years old. You have a family history of lipomas. What are the signs or symptoms? A lipoma usually appears as a small, round bump under the skin. In most cases, the lump will: Feel soft or rubbery. Not cause pain or other symptoms. However, if a lipoma is located in an area where it pushes on nerves, it can become painful or cause other symptoms. How is this diagnosed? A lipoma can usually be diagnosed with a physical exam. You may also have tests to confirm the diagnosis and to rule out other conditions. Tests may include: Imaging tests, such as a CT scan or an MRI. Removal of a tissue sample to be looked at under a microscope (biopsy). How is this treated? Treatment for this condition depends on the size of the lipoma and whether it is causing any symptoms. For small lipomas that are not causing problems, no treatment is needed. If a lipoma is bigger or it causes problems, surgery may be done to remove the lipoma. Lipomas can also be removed to improve appearance. Most often, the procedure is done after applying a medicine that numbs the area (local  anesthetic). Liposuction may be done to reduce the size of the lipoma before it is removed through surgery, or it may be done to remove the lipoma. Lipomas are removed with this method to limit incision size and scarring. A liposuction tube is inserted through a small incision into the lipoma, and the contents of the lipoma are removed through the tube with suction. Follow these instructions at home: Watch your lipoma for any changes. Keep all follow-up visits. This is important. Where to find more information OrthoInfo: orthoinfo.aaos.org Contact a health care provider if: Your lipoma becomes larger or hard. Your lipoma becomes painful, red, or increasingly swollen. These could be signs of infection or a more serious condition. Get help right away if: You develop tingling or numbness in an area near the lipoma. This could indicate that the lipoma is causing nerve damage. Summary A lipoma is a noncancerous tumor that is made up of fat cells. Most lipomas do not cause problems and do not require treatment. If a lipoma is bigger or it causes problems, surgery may be done to remove the lipoma. Contact a health care provider if your lipoma becomes larger or hard, or if it becomes painful, red, or increasingly swollen. These could be signs of infection or a more serious condition. This information is not intended to replace advice given to you by your health care provider. Make sure you discuss any questions you have with your health care provider. Document Revised:  05/21/2021 Document Reviewed: 05/21/2021 Elsevier Patient Education  [place].
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
## 2021-11-04 ENCOUNTER — Ambulatory Visit
Admission: RE | Admit: 2021-11-04 | Discharge: 2021-11-04 | Disposition: A | Discharge status: Home / Self Care | Payer: Federal, State, Local not specified - PPO | Source: Ambulatory Visit | Attending: Surgery | Admitting: Surgery

## 2021-11-04 DIAGNOSIS — R222 Localized swelling, mass and lump, trunk: Secondary | ICD-10-CM | POA: Diagnosis not present

## 2021-11-04 DIAGNOSIS — R918 Other nonspecific abnormal finding of lung field: Secondary | ICD-10-CM | POA: Diagnosis not present

## 2021-11-04 MED ORDER — IOHEXOL 300 MG/ML  SOLN
75.0000 mL | Freq: Once | INTRAMUSCULAR | Status: AC | PRN
  Administered 2021-11-04: 75 mL via INTRAVENOUS

## 2021-11-11 ENCOUNTER — Telehealth: Payer: Self-pay | Admitting: Family Medicine

## 2021-11-11 NOTE — Telephone Encounter (Signed)
Pt calling for CT results.  FO Rep explained that the data from the CT test is available via my chart but that information has not been interpreted yet. Once it is, the team will contact the patient.  Pt still requests a call back about results from PCP.

## 2021-11-11 NOTE — Telephone Encounter (Signed)
Hi Angela, I see that you work with Dr. Dahlia Byes and this patient called our office looking for results of this scan and wanting to discuss next options. I advised since he had not heard anything from you all regarding his CT to follow up with your office since Dr. Yong Channel was not the ordering physician and there is nothing resulted.

## 2021-11-29 ENCOUNTER — Encounter: Payer: Self-pay | Admitting: Surgery

## 2021-11-29 ENCOUNTER — Ambulatory Visit (INDEPENDENT_AMBULATORY_CARE_PROVIDER_SITE_OTHER): Payer: Federal, State, Local not specified - PPO | Admitting: Surgery

## 2021-11-29 DIAGNOSIS — R222 Localized swelling, mass and lump, trunk: Secondary | ICD-10-CM | POA: Diagnosis not present

## 2021-11-29 NOTE — Progress Notes (Signed)
Referring provider:  Marin Olp, MD [address] [place],  [place] [address]  Virtual Visit via Telemedicine Note  I connected with Brandon Clayton by telephone/his/home on 11/29/21 at 10:30 AM EDT and verified that I was speaking with the correct person using their name and two idenfiers/date of birth.   I discussed the limitations, risks, security and privacy concerns of performing an evaluation and management service by telephonic/video telemedicine and the availability of in person appointments. I also discussed with the patient that there may be a patient responsible charge related to this service. The patient expressed understanding and agreed to proceed.  History of Present Illness: 46 year old male with history of mediastinal mass and multiple subcutaneous lipomas in the subscapular area.  Last time he noticed some hardness within the subscapular process.  At that time I thought it was the subxiphoid but given that he had a prior resection of the lipoma I wanted to make sure there was no evidence of lipoma recurrence.  He did have a CT scan of the chest that I personally reviewed showing evidence of a small stent lesion thymus and anterior mediastinum there is no evidence of subxiphoid hernia or other lesions.  Please note that I had an extensive discussion with the about CT findings.  He was quite concerned regarding the mediastinal persistent lesion.  I gust with him that this was likely of no clinical significance but given that he had a mediastinal surgery that he may have to reach back to his surgeon. I did compare to CT scans.  Review of Systems:  Assessment:  46 y.o. yo Male with a problem list including...  Patient Active Problem List   Diagnosis Date Noted   Mediastinal mass 02/24/2021   Chest pain 11/18/2020   Syncope 11/18/2020   Pericardial cyst 11/18/2020   Obesity (BMI 30-39.9) 11/18/2020   Hyperlipidemia 10/23/2020   Acute left ankle pain  03/31/2018   Osteoarthritis of spine with radiculopathy, cervical region 12/04/2017   Paresthesias 11/28/2017   Lipoma of right upper extremity 07/18/2017   Abscess 07/29/2016    Follow-up Instructions / Plan:  Subxiphoid discomfort at this time I do not recommend any further work-up for any other intervention.  Discussed with him in detail that the continues to have lesion is of no clinical significance but he needed to reach out to his thoracic surgeon.  Certainly this is not malignant although requiring immediate attention at this time.  States that counseling was provided  The patient was advised to call back or seek an in-person evaluation if the symptoms worsen or if the condition fails to improve as anticipated.  From [facility] outpatient surgery office, I provided 22 minutes of non-face-to-face time during this encounter.  This included extensive counseling and personally reviewing imaging studies.  -- D. Alfredo Spong, MD,[place]: [facility] - Partnering for exceptional care. Office: [phone]

## 2022-01-11 ENCOUNTER — Encounter: Payer: Self-pay | Admitting: Family Medicine

## 2022-01-11 ENCOUNTER — Ambulatory Visit (INDEPENDENT_AMBULATORY_CARE_PROVIDER_SITE_OTHER): Payer: Federal, State, Local not specified - PPO | Admitting: Family Medicine

## 2022-01-11 VITALS — BP 110/72 | HR 79 | Temp 97.8°F | Ht 69.0 in | Wt 191.0 lb

## 2022-01-11 DIAGNOSIS — Z0000 Encounter for general adult medical examination without abnormal findings: Secondary | ICD-10-CM

## 2022-01-11 DIAGNOSIS — E782 Mixed hyperlipidemia: Secondary | ICD-10-CM | POA: Diagnosis not present

## 2022-01-11 DIAGNOSIS — Z1211 Encounter for screening for malignant neoplasm of colon: Secondary | ICD-10-CM

## 2022-01-11 DIAGNOSIS — Z125 Encounter for screening for malignant neoplasm of prostate: Secondary | ICD-10-CM | POA: Diagnosis not present

## 2022-01-11 DIAGNOSIS — Q248 Other specified congenital malformations of heart: Secondary | ICD-10-CM

## 2022-01-11 LAB — CBC WITH DIFFERENTIAL/PLATELET
Basophils Absolute: 0 10*3/uL (ref 0.0–0.1)
Basophils Relative: 0.5 % (ref 0.0–3.0)
Eosinophils Absolute: 0.2 10*3/uL (ref 0.0–0.7)
Eosinophils Relative: 2.4 % (ref 0.0–5.0)
HCT: 42.8 % (ref 39.0–52.0)
Hemoglobin: 14.6 g/dL (ref 13.0–17.0)
Lymphocytes Relative: 29.3 % (ref 12.0–46.0)
Lymphs Abs: 2 10*3/uL (ref 0.7–4.0)
MCHC: 34.2 g/dL (ref 30.0–36.0)
MCV: 93.7 fl (ref 78.0–100.0)
Monocytes Absolute: 0.5 10*3/uL (ref 0.1–1.0)
Monocytes Relative: 8.1 % (ref 3.0–12.0)
Neutro Abs: 4 10*3/uL (ref 1.4–7.7)
Neutrophils Relative %: 59.7 % (ref 43.0–77.0)
Platelets: 316 10*3/uL (ref 150.0–400.0)
RBC: 4.57 Mil/uL (ref 4.22–5.81)
RDW: 12.5 % (ref 11.5–15.5)
WBC: 6.7 10*3/uL (ref 4.0–10.5)

## 2022-01-11 LAB — COMPREHENSIVE METABOLIC PANEL
ALT: 23 U/L (ref 0–53)
AST: 19 U/L (ref 0–37)
Albumin: 4.3 g/dL (ref 3.5–5.2)
Alkaline Phosphatase: 61 U/L (ref 39–117)
BUN: 16 mg/dL (ref 6–23)
CO2: 23 mEq/L (ref 19–32)
Calcium: 9.4 mg/dL (ref 8.4–10.5)
Chloride: 106 mEq/L (ref 96–112)
Creatinine, Ser: 1.04 mg/dL (ref 0.40–1.50)
GFR: 87.1 mL/min (ref >60.00)
Glucose, Bld: 87 mg/dL (ref 70–99)
Potassium: 4.4 mEq/L (ref 3.5–5.1)
Sodium: 140 mEq/L (ref 135–145)
Total Bilirubin: 0.7 mg/dL (ref 0.2–1.2)
Total Protein: 7.1 g/dL (ref 6.0–8.3)

## 2022-01-11 LAB — LIPID PANEL
Cholesterol: 154 mg/dL (ref 0–200)
HDL: 44.7 mg/dL (ref >39.00)
LDL Cholesterol: 88 mg/dL (ref 0–99)
NonHDL: 109.59
Total CHOL/HDL Ratio: 3
Triglycerides: 110 mg/dL (ref 0.0–149.0)
VLDL: 22 mg/dL (ref 0.0–40.0)

## 2022-01-11 LAB — TSH: TSH: 1.43 u[IU]/mL (ref 0.35–5.50)

## 2022-01-11 LAB — PSA: PSA: 1.12 ng/mL (ref 0.10–4.00)
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
## 2022-01-11 NOTE — Patient Instructions (Addendum)
Please stop by lab before you go If you have mychart- we will send your results within 3 business days of [place] receiving them.  If you do not have mychart- we will call you about results within 5 business days of [place] receiving them.  *please also note that you will see labs on mychart as soon as they post. I will later go in and write notes on them- will say "notes from Dr. Yong Channel"   [facility] contact Please call to schedule visit and/or procedure Address: [address], [place], [place] [address] Phone: [phone]   Schedule follow up with Dr. Kipp Brood  Recommended follow up: Return in about 1 year (around 01/12/2023) for physical or sooner if needed.Schedule b4 you leave.

## 2022-01-12 ENCOUNTER — Telehealth: Payer: Self-pay | Admitting: *Deleted

## 2022-01-12 ENCOUNTER — Encounter: Payer: Self-pay | Admitting: Internal Medicine

## 2022-01-12 NOTE — Telephone Encounter (Signed)
Patient contacted our office requesting an appt with Dr. Kipp Brood s/p resection of mediastinal mass 02/24/2021. Per patient, he was seen by his PCP yesterday who suggested he follow up with Dr. Kipp Brood. Appt scheduled for 9/22.

## 2022-02-04 ENCOUNTER — Ambulatory Visit (AMBULATORY_SURGERY_CENTER): Payer: Self-pay

## 2022-02-04 ENCOUNTER — Encounter: Payer: Self-pay | Admitting: *Deleted

## 2022-02-04 ENCOUNTER — Ambulatory Visit: Payer: Federal, State, Local not specified - PPO | Admitting: Thoracic Surgery (Cardiothoracic Vascular Surgery)

## 2022-02-04 ENCOUNTER — Other Ambulatory Visit: Payer: Self-pay | Admitting: *Deleted

## 2022-02-04 VITALS — BP 119/73 | HR 78 | Resp 18 | Ht 69.0 in | Wt 193.0 lb

## 2022-02-04 VITALS — Ht 69.0 in | Wt 194.0 lb

## 2022-02-04 DIAGNOSIS — Q248 Other specified congenital malformations of heart: Secondary | ICD-10-CM

## 2022-02-04 DIAGNOSIS — Z1211 Encounter for screening for malignant neoplasm of colon: Secondary | ICD-10-CM

## 2022-02-04 MED ORDER — NA SULFATE-K SULFATE-MG SULF 17.5-3.13-1.6 GM/177ML PO SOLN: 1.0000 | Freq: Once | ORAL | 0 refills | Status: AC

## 2022-02-04 NOTE — H&P (View-Only) (Signed)
      [address]       [place], [address]             [phone]        Brandon Clayton [facility] Medical Record #[id number] Date of Birth: 02/27/1976  Referring: Berniece Salines, DO Primary Care: Marin Olp, MD Primary Cardiologist:Kardie Tobb, DO  Reason for visit:   follow-up  History of Present Illness:     46 year old male who previously underwent resection of an anterior mediastinal mass.  Unfortunately there appears to be a remnant on cross-sectional imaging.  He is very concerned given his history of lipomatous disease.  He would like this removed.  Physical Exam: BP 119/73 (BP Location: Left Arm, Patient Position: Sitting)   Pulse 78   Resp 18   Ht '5\' 9"'$  (1.753 m)   Wt 193 lb (87.5 kg)   SpO2 96% Comment: RA  BMI 28.50 kg/m   Alert NAD Regular rate. Easy work of breathing. Abdomen, ND No peripheral edema   Diagnostic Studies & Laboratory data: CT chest  IMPRESSION: 1. Well-circumscribed 11 mm fluid density structure in the anterior mediastinum has minimally increased in size from prior exam, previously 7 mm. This may represent a thymic or pericardial cyst, however is nonspecific. This is of doubtful clinical significance. There is no adjacent soft tissue stranding or inflammation. 2. Xiphoid process curves anteriorly. No discrete chest wall or parasternal soft tissue mass. 3. No acute intrathoracic abnormality.  Assessment / Plan:   46 year old male with a pericardial cyst.  He previously underwent a right robotic assisted thoracoscopy with excision of his anterior mediastinal tissue.  On review of the cross-sectional imaging that the cyst does not appear to be below with the pericardium.  It is 1.1 cm in size.  He would like this removed.  He is agreeable to undergo another right robotic assisted thoracoscopy with excision of the pericardial cyst.  The risks and benefits of been discussed he is tentatively scheduled for  early October.   Lajuana Matte 02/04/2022 3:40 PM

## 2022-02-04 NOTE — Progress Notes (Signed)
No egg or soy allergy known to patient  No issues known to pt with past sedation with any surgeries or procedures---patient reports he "will need to be "extra sedated" for all procedures as he "filters medications quickly" Patient denies ever being told they had issues or difficulty with intubation------no issues reported by patient and none noted  No FH of Malignant Hyperthermia Pt is not on diet pills Pt is not on home 02  Pt is not on blood thinners  Pt denies issues with constipation  No A fib or A flutter Have any cardiac testing pending--NO Pt instructed to use [email or web address] or GoodRx for a price reduction on prep  Insurance verified during PV appt=[employer]

## 2022-02-04 NOTE — Progress Notes (Signed)
      [address]       [place] [address]             [phone]        Burdett Edward Mellor III [place] Medical Record #[id number] Date of Birth: 1976/04/28  Referring: Berniece Salines, DO Primary Care: Marin Olp, MD Primary Cardiologist:Kardie Tobb, DO  Reason for visit:   follow-up  History of Present Illness:     46 year old male who previously underwent resection of an anterior mediastinal mass.  Unfortunately there appears to be a remnant on cross-sectional imaging.  He is very concerned given his history of lipomatous disease.  He would like this removed.  Physical Exam: BP 119/73 (BP Location: Left Arm, Patient Position: Sitting)   Pulse 78   Resp 18   Ht '5\' 9"'$  (1.753 m)   Wt 193 lb (87.5 kg)   SpO2 96% Comment: RA  BMI 28.50 kg/m   Alert NAD Regular rate. Easy work of breathing. Abdomen, ND No peripheral edema   Diagnostic Studies & Laboratory data: CT chest  IMPRESSION: 1. Well-circumscribed 11 mm fluid density structure in the anterior mediastinum has minimally increased in size from prior exam, previously 7 mm. This may represent a thymic or pericardial cyst, however is nonspecific. This is of doubtful clinical significance. There is no adjacent soft tissue stranding or inflammation. 2. Xiphoid process curves anteriorly. No discrete chest wall or parasternal soft tissue mass. 3. No acute intrathoracic abnormality.  Assessment / Plan:   46 year old male with a pericardial cyst.  He previously underwent a right robotic assisted thoracoscopy with excision of his anterior mediastinal tissue.  On review of the cross-sectional imaging that the cyst does not appear to be below with the pericardium.  It is 1.1 cm in size.  He would like this removed.  He is agreeable to undergo another right robotic assisted thoracoscopy with excision of the pericardial cyst.  The risks and benefits of been discussed he is tentatively scheduled for  early October.   Lajuana Matte 02/04/2022 3:40 PM

## 2022-02-11 NOTE — Pre-Procedure Instructions (Signed)
Surgical Instructions    Your procedure is scheduled on Wednesday, October 4th.  Report to [facility] Main Entrance "A" at 10:30 A.M., then check in with the Admitting office.  Call this number if you have problems the morning of surgery:  [phone]   If you have any questions prior to your surgery date call [phone]: Open Monday-Friday 8am-4pm    Remember:  Do not eat or drink after midnight the night before your surgery     Take these medicines the morning of surgery with A SIP OF WATER: none  As of today, STOP taking any Aspirin (unless otherwise instructed by your surgeon) Aleve, Naproxen, Ibuprofen, Motrin, Advil, Goody's, BC's, all herbal medications, fish oil, and all vitamins.                     Do NOT Smoke (Tobacco/Vaping) for 24 hours prior to your procedure.  If you use a CPAP at night, you may bring your mask/headgear for your overnight stay.   Contacts, glasses, piercing's, hearing aid's, dentures or partials may not be worn into surgery, please bring cases for these belongings.    For patients admitted to the hospital, discharge time will be determined by your treatment team.   Patients discharged the day of surgery will not be allowed to drive home, and someone needs to stay with them for 24 hours.  SURGICAL WAITING ROOM VISITATION Patients having surgery or a procedure may have no more than 2 support people in the waiting area - these visitors may rotate.   Children under the age of 20 must have an adult with them who is not the patient. If the patient needs to stay at the hospital during part of their recovery, the visitor guidelines for inpatient rooms apply. Pre-op nurse will coordinate an appropriate time for 1 support person to accompany patient in pre-op.  This support person may not rotate.   Please refer to the [facility] website for the visitor guidelines for Inpatients (after your surgery is over and you are in a regular room).    Special  instructions:   [facility]- Preparing For Surgery  Before surgery, you can play an important role. Because skin is not sterile, your skin needs to be as free of germs as possible. You can reduce the number of germs on your skin by washing with CHG (chlorahexidine gluconate) Soap before surgery.  CHG is an antiseptic cleaner which kills germs and bonds with the skin to continue killing germs even after washing.    Oral Hygiene is also important to reduce your risk of infection.  Remember - BRUSH YOUR TEETH THE MORNING OF SURGERY WITH YOUR REGULAR TOOTHPASTE  Please do not use if you have an allergy to CHG or antibacterial soaps. If your skin becomes reddened/irritated stop using the CHG.  Do not shave (including legs and underarms) for at least 48 hours prior to first CHG shower. It is OK to shave your face.  Please follow these instructions carefully.   Shower the NIGHT BEFORE SURGERY and the MORNING OF SURGERY  If you chose to wash your hair, wash your hair first as usual with your normal shampoo.  After you shampoo, rinse your hair and body thoroughly to remove the shampoo.  Use CHG Soap as you would any other liquid soap. You can apply CHG directly to the skin and wash gently with a scrungie or a clean washcloth.   Apply the CHG Soap to your body ONLY FROM  THE NECK DOWN.  Do not use on open wounds or open sores. Avoid contact with your eyes, ears, mouth and genitals (private parts). Wash Face and genitals (private parts)  with your normal soap.   Wash thoroughly, paying special attention to the area where your surgery will be performed.  Thoroughly rinse your body with warm water from the neck down.  DO NOT shower/wash with your normal soap after using and rinsing off the CHG Soap.  Pat yourself dry with a CLEAN TOWEL.  Wear CLEAN PAJAMAS to bed the night before surgery  Place CLEAN SHEETS on your bed the night before your surgery  DO NOT SLEEP WITH PETS.   Day of  Surgery: Take a shower with CHG soap. Do not wear jewelry  Do not wear lotions, powders, colognes, or deodorant. Men may shave face and neck. Do not bring valuables to the hospital. [facility] is not responsible for any belongings or valuables.  Wear Clean/Comfortable clothing the morning of surgery Remember to brush your teeth WITH YOUR REGULAR TOOTHPASTE.   Please read over the following fact sheets that you were given.    If you received a COVID test during your pre-op visit  it is requested that you wear a mask when out in public, stay away from anyone that may not be feeling well and notify your surgeon if you develop symptoms. If you have been in contact with anyone that has tested positive in the last 10 days please notify you surgeon.

## 2022-02-14 ENCOUNTER — Other Ambulatory Visit: Payer: Self-pay

## 2022-02-14 ENCOUNTER — Ambulatory Visit (HOSPITAL_COMMUNITY)
Admission: RE | Admit: 2022-02-14 | Discharge: 2022-02-14 | Disposition: A | Discharge status: Home / Self Care | Payer: Federal, State, Local not specified - PPO | Source: Ambulatory Visit | Attending: Thoracic Surgery (Cardiothoracic Vascular Surgery) | Admitting: Thoracic Surgery (Cardiothoracic Vascular Surgery)

## 2022-02-14 ENCOUNTER — Encounter (HOSPITAL_COMMUNITY)
Admission: RE | Admit: 2022-02-14 | Discharge: 2022-02-14 | Disposition: A | Discharge status: Home / Self Care | Payer: Federal, State, Local not specified - PPO | Source: Ambulatory Visit | Attending: Thoracic Surgery (Cardiothoracic Vascular Surgery) | Admitting: Thoracic Surgery (Cardiothoracic Vascular Surgery)

## 2022-02-14 ENCOUNTER — Encounter (HOSPITAL_COMMUNITY): Payer: Self-pay

## 2022-02-14 VITALS — BP 119/81 | HR 68 | Temp 98.1°F | Resp 17 | Ht 69.0 in | Wt 187.5 lb

## 2022-02-14 DIAGNOSIS — Z01818 Encounter for other preprocedural examination: Secondary | ICD-10-CM | POA: Insufficient documentation

## 2022-02-14 DIAGNOSIS — Z1152 Encounter for screening for COVID-19: Secondary | ICD-10-CM | POA: Diagnosis not present

## 2022-02-14 DIAGNOSIS — Q248 Other specified congenital malformations of heart: Secondary | ICD-10-CM | POA: Insufficient documentation

## 2022-02-14 DIAGNOSIS — Z20822 Contact with and (suspected) exposure to covid-19: Secondary | ICD-10-CM | POA: Insufficient documentation

## 2022-02-14 DIAGNOSIS — D72829 Elevated white blood cell count, unspecified: Secondary | ICD-10-CM | POA: Diagnosis not present

## 2022-02-14 DIAGNOSIS — R222 Localized swelling, mass and lump, trunk: Secondary | ICD-10-CM | POA: Diagnosis not present

## 2022-02-14 DIAGNOSIS — E328 Other diseases of thymus: Secondary | ICD-10-CM | POA: Diagnosis not present

## 2022-02-14 LAB — CBC
HCT: 45.3 % (ref 39.0–52.0)
Hemoglobin: 15.5 g/dL (ref 13.0–17.0)
MCH: 32.2 pg (ref 26.0–34.0)
MCHC: 34.2 g/dL (ref 30.0–36.0)
MCV: 94.2 fL (ref 80.0–100.0)
Platelets: 335 10*3/uL (ref 150–400)
RBC: 4.81 MIL/uL (ref 4.22–5.81)
RDW: 11.9 % (ref 11.5–15.5)
WBC: 7.8 10*3/uL (ref 4.0–10.5)
nRBC: 0 % (ref 0.0–0.2)

## 2022-02-14 LAB — TYPE AND SCREEN
ABO/RH(D): O POS
Antibody Screen: NEGATIVE

## 2022-02-14 LAB — SARS CORONAVIRUS 2 (TAT 6-24 HRS): SARS Coronavirus 2: NEGATIVE

## 2022-02-14 LAB — URINALYSIS, ROUTINE W REFLEX MICROSCOPIC
Bilirubin Urine: NEGATIVE
Glucose, UA: NEGATIVE mg/dL
Hgb urine dipstick: NEGATIVE
Ketones, ur: NEGATIVE mg/dL
Leukocytes,Ua: NEGATIVE
Nitrite: NEGATIVE
Protein, ur: NEGATIVE mg/dL
Specific Gravity, Urine: 1.016 (ref 1.005–1.030)
pH: 6 (ref 5.0–8.0)

## 2022-02-14 LAB — COMPREHENSIVE METABOLIC PANEL
ALT: 36 U/L (ref 0–44)
AST: 25 U/L (ref 15–41)
Albumin: 4.1 g/dL (ref 3.5–5.0)
Alkaline Phosphatase: 68 U/L (ref 38–126)
Anion gap: 5 (ref 5–15)
BUN: 11 mg/dL (ref 6–20)
CO2: 26 mmol/L (ref 22–32)
Calcium: 9.4 mg/dL (ref 8.9–10.3)
Chloride: 108 mmol/L (ref 98–111)
Creatinine, Ser: 1.1 mg/dL (ref 0.61–1.24)
GFR, Estimated: >60 mL/min (ref >60)
Glucose, Bld: 91 mg/dL (ref 70–99)
Potassium: 4.2 mmol/L (ref 3.5–5.1)
Sodium: 139 mmol/L (ref 135–145)
Total Bilirubin: 1 mg/dL (ref 0.3–1.2)
Total Protein: 7 g/dL (ref 6.5–8.1)

## 2022-02-14 LAB — APTT: aPTT: 30 seconds (ref 24–36)

## 2022-02-14 LAB — SURGICAL PCR SCREEN
MRSA, PCR: NEGATIVE
Staphylococcus aureus: POSITIVE — AB

## 2022-02-14 LAB — PROTIME-INR
INR: 1 (ref 0.8–1.2)
Prothrombin Time: 13.4 seconds (ref 11.4–15.2)

## 2022-02-14 NOTE — Progress Notes (Signed)
PCP - Dr. Garret Reddish Cardiologist - Dr. Berniece Salines  PPM/ICD - denies   Chest x-ray - 02/14/22 EKG - 02/14/22 Stress Test - 11/17/20 ECHO - 11/17/20 Cardiac Cath - denies  Sleep Study - denies   DM- denies  ASA/Blood Thinner Instructions: n/a   ERAS Protcol - no, NPO   COVID TEST- 02/14/22   Anesthesia review: yes, cardiac hx  Patient denies shortness of breath, fever, cough and chest pain at PAT appointment   All instructions explained to the patient, with a verbal understanding of the material. Patient agrees to go over the instructions while at home for a better understanding. Patient also instructed to wear a mask in public after being tested for COVID-19. The opportunity to ask questions was provided.

## 2022-02-15 ENCOUNTER — Telehealth: Payer: Self-pay

## 2022-02-15 NOTE — Telephone Encounter (Signed)
FMLA form completed. Beginning LOA 02/16/22 through approx 04/18/22. Patient will pick up form from front desk/

## 2022-02-16 ENCOUNTER — Inpatient Hospital Stay (HOSPITAL_COMMUNITY): Payer: Federal, State, Local not specified - PPO | Admitting: General Practice

## 2022-02-16 ENCOUNTER — Encounter (HOSPITAL_COMMUNITY)
Admission: RE | Disposition: A | Discharge status: Home / Self Care | Payer: Self-pay | Source: Home / Self Care | Attending: Thoracic Surgery (Cardiothoracic Vascular Surgery)

## 2022-02-16 ENCOUNTER — Inpatient Hospital Stay (HOSPITAL_COMMUNITY): Payer: Federal, State, Local not specified - PPO

## 2022-02-16 ENCOUNTER — Inpatient Hospital Stay (HOSPITAL_COMMUNITY)
Admission: RE | Admit: 2022-02-16 | Discharge: 2022-02-17 | DRG: 989 | Disposition: A | Discharge status: Home / Self Care | Payer: Federal, State, Local not specified - PPO | Attending: Thoracic Surgery (Cardiothoracic Vascular Surgery) | Admitting: Thoracic Surgery (Cardiothoracic Vascular Surgery)

## 2022-02-16 ENCOUNTER — Encounter (HOSPITAL_COMMUNITY): Payer: Self-pay | Admitting: Thoracic Surgery (Cardiothoracic Vascular Surgery)

## 2022-02-16 ENCOUNTER — Inpatient Hospital Stay (HOSPITAL_COMMUNITY): Payer: Federal, State, Local not specified - PPO | Admitting: Physician Assistant

## 2022-02-16 ENCOUNTER — Other Ambulatory Visit: Payer: Self-pay

## 2022-02-16 DIAGNOSIS — Z9889 Other specified postprocedural states: Principal | ICD-10-CM

## 2022-02-16 DIAGNOSIS — E328 Other diseases of thymus: Secondary | ICD-10-CM | POA: Diagnosis present

## 2022-02-16 DIAGNOSIS — J9811 Atelectasis: Secondary | ICD-10-CM | POA: Diagnosis not present

## 2022-02-16 DIAGNOSIS — D72829 Elevated white blood cell count, unspecified: Secondary | ICD-10-CM | POA: Diagnosis not present

## 2022-02-16 DIAGNOSIS — Z1152 Encounter for screening for COVID-19: Secondary | ICD-10-CM | POA: Diagnosis not present

## 2022-02-16 DIAGNOSIS — I318 Other specified diseases of pericardium: Secondary | ICD-10-CM | POA: Diagnosis not present

## 2022-02-16 DIAGNOSIS — R222 Localized swelling, mass and lump, trunk: Secondary | ICD-10-CM | POA: Diagnosis present

## 2022-02-16 DIAGNOSIS — J811 Chronic pulmonary edema: Secondary | ICD-10-CM | POA: Diagnosis not present

## 2022-02-16 DIAGNOSIS — Q248 Other specified congenital malformations of heart: Secondary | ICD-10-CM

## 2022-02-16 DIAGNOSIS — Q341 Congenital cyst of mediastinum: Secondary | ICD-10-CM | POA: Diagnosis not present

## 2022-02-16 DIAGNOSIS — J939 Pneumothorax, unspecified: Secondary | ICD-10-CM | POA: Diagnosis not present

## 2022-02-16 HISTORY — PX: OTHER SURGICAL HISTORY: SHX169

## 2022-02-16 SURGERY — EXCISION, MASS, MEDIASTINUM, ROBOT-ASSISTED
Anesthesia: General | Laterality: Right

## 2022-02-16 MED ORDER — OXYCODONE HCL 5 MG/5ML PO SOLN
ORAL | Status: AC
  Filled 2022-02-16: qty 5

## 2022-02-16 MED ORDER — FENTANYL CITRATE (PF) 250 MCG/5ML IJ SOLN
INTRAMUSCULAR | Status: AC
  Filled 2022-02-16: qty 5

## 2022-02-16 MED ORDER — LIDOCAINE 2% (20 MG/ML) 5 ML SYRINGE
INTRAMUSCULAR | Status: DC | PRN
  Administered 2022-02-16: 60 mg via INTRAVENOUS

## 2022-02-16 MED ORDER — ACETAMINOPHEN 500 MG PO TABS
1000.0000 mg | ORAL_TABLET | Freq: Once | ORAL | Status: AC
  Administered 2022-02-16: 1000 mg via ORAL
  Filled 2022-02-16: qty 2

## 2022-02-16 MED ORDER — FENTANYL CITRATE (PF) 250 MCG/5ML IJ SOLN
INTRAMUSCULAR | Status: DC | PRN
  Administered 2022-02-16: 50 ug via INTRAVENOUS
  Administered 2022-02-16: 100 ug via INTRAVENOUS

## 2022-02-16 MED ORDER — ONDANSETRON HCL 4 MG/2ML IJ SOLN
INTRAMUSCULAR | Status: AC
  Filled 2022-02-16: qty 2

## 2022-02-16 MED ORDER — TRAMADOL HCL 50 MG PO TABS: 50.0000 mg | ORAL_TABLET | Freq: Four times a day (QID) | ORAL | Status: DC | PRN

## 2022-02-16 MED ORDER — FENTANYL CITRATE (PF) 100 MCG/2ML IJ SOLN
INTRAMUSCULAR | Status: AC
  Filled 2022-02-16: qty 2

## 2022-02-16 MED ORDER — CHLORHEXIDINE GLUCONATE 0.12 % MT SOLN
15.0000 mL | Freq: Once | OROMUCOSAL | Status: AC
  Administered 2022-02-16: 15 mL via OROMUCOSAL
  Filled 2022-02-16: qty 15

## 2022-02-16 MED ORDER — DEXMEDETOMIDINE HCL IN NACL 80 MCG/20ML IV SOLN
INTRAVENOUS | Status: DC | PRN
  Administered 2022-02-16 (×4): 8 ug via BUCCAL

## 2022-02-16 MED ORDER — SENNOSIDES-DOCUSATE SODIUM 8.6-50 MG PO TABS
1.0000 | ORAL_TABLET | Freq: Every day | ORAL | Status: DC
  Administered 2022-02-16: 1 via ORAL
  Filled 2022-02-16: qty 1

## 2022-02-16 MED ORDER — ACETAMINOPHEN 160 MG/5ML PO SOLN: 1000.0000 mg | Freq: Four times a day (QID) | ORAL | Status: DC

## 2022-02-16 MED ORDER — DEXAMETHASONE SODIUM PHOSPHATE 10 MG/ML IJ SOLN
INTRAMUSCULAR | Status: DC | PRN
  Administered 2022-02-16: 10 mg via INTRAVENOUS

## 2022-02-16 MED ORDER — CEFAZOLIN SODIUM-DEXTROSE 2-4 GM/100ML-% IV SOLN
2.0000 g | INTRAVENOUS | Status: AC
  Administered 2022-02-16: 2 g via INTRAVENOUS
  Filled 2022-02-16: qty 100

## 2022-02-16 MED ORDER — ENOXAPARIN SODIUM 40 MG/0.4ML IJ SOSY
40.0000 mg | PREFILLED_SYRINGE | Freq: Every day | INTRAMUSCULAR | Status: DC
  Administered 2022-02-17: 40 mg via SUBCUTANEOUS
  Filled 2022-02-16: qty 0.4

## 2022-02-16 MED ORDER — MIDAZOLAM HCL 2 MG/2ML IJ SOLN
INTRAMUSCULAR | Status: DC | PRN
  Administered 2022-02-16: 2 mg via INTRAVENOUS

## 2022-02-16 MED ORDER — DEXMEDETOMIDINE HCL IN NACL 80 MCG/20ML IV SOLN
INTRAVENOUS | Status: AC
  Filled 2022-02-16: qty 20

## 2022-02-16 MED ORDER — ORAL CARE MOUTH RINSE: 15.0000 mL | Freq: Once | OROMUCOSAL | Status: AC

## 2022-02-16 MED ORDER — ONDANSETRON HCL 4 MG/2ML IJ SOLN: 4.0000 mg | Freq: Four times a day (QID) | INTRAMUSCULAR | Status: DC | PRN

## 2022-02-16 MED ORDER — ROCURONIUM BROMIDE 10 MG/ML (PF) SYRINGE
PREFILLED_SYRINGE | INTRAVENOUS | Status: DC | PRN
  Administered 2022-02-16: 70 mg via INTRAVENOUS
  Administered 2022-02-16: 20 mg via INTRAVENOUS

## 2022-02-16 MED ORDER — CEFAZOLIN SODIUM-DEXTROSE 2-4 GM/100ML-% IV SOLN
2.0000 g | Freq: Three times a day (TID) | INTRAVENOUS | Status: AC
  Administered 2022-02-16 – 2022-02-17 (×2): 2 g via INTRAVENOUS
  Filled 2022-02-16 (×2): qty 100

## 2022-02-16 MED ORDER — PROPOFOL 10 MG/ML IV BOLUS
INTRAVENOUS | Status: DC | PRN
  Administered 2022-02-16: 200 mg via INTRAVENOUS

## 2022-02-16 MED ORDER — KETOROLAC TROMETHAMINE 15 MG/ML IJ SOLN
15.0000 mg | Freq: Four times a day (QID) | INTRAMUSCULAR | Status: DC
  Administered 2022-02-16 – 2022-02-17 (×4): 15 mg via INTRAVENOUS
  Filled 2022-02-16 (×4): qty 1

## 2022-02-16 MED ORDER — SUGAMMADEX SODIUM 200 MG/2ML IV SOLN
INTRAVENOUS | Status: DC | PRN
  Administered 2022-02-16: 200 mg via INTRAVENOUS

## 2022-02-16 MED ORDER — BUPIVACAINE LIPOSOME 1.3 % IJ SUSP
INTRAMUSCULAR | Status: DC | PRN
  Administered 2022-02-16: 50 mL

## 2022-02-16 MED ORDER — LACTATED RINGERS IV SOLN: INTRAVENOUS | Status: DC

## 2022-02-16 MED ORDER — LIDOCAINE 2% (20 MG/ML) 5 ML SYRINGE
INTRAMUSCULAR | Status: AC
  Filled 2022-02-16: qty 5

## 2022-02-16 MED ORDER — DEXAMETHASONE SODIUM PHOSPHATE 10 MG/ML IJ SOLN
INTRAMUSCULAR | Status: AC
  Filled 2022-02-16: qty 1

## 2022-02-16 MED ORDER — ONDANSETRON HCL 4 MG/2ML IJ SOLN
INTRAMUSCULAR | Status: DC | PRN
  Administered 2022-02-16: 4 mg via INTRAVENOUS

## 2022-02-16 MED ORDER — ROCURONIUM BROMIDE 10 MG/ML (PF) SYRINGE
PREFILLED_SYRINGE | INTRAVENOUS | Status: AC
  Filled 2022-02-16: qty 20

## 2022-02-16 MED ORDER — PROMETHAZINE HCL 25 MG/ML IJ SOLN: 6.2500 mg | INTRAMUSCULAR | Status: DC | PRN

## 2022-02-16 MED ORDER — FENTANYL CITRATE (PF) 100 MCG/2ML IJ SOLN
25.0000 ug | INTRAMUSCULAR | Status: DC | PRN
  Administered 2022-02-16 (×2): 50 ug via INTRAVENOUS

## 2022-02-16 MED ORDER — LACTATED RINGERS IV SOLN: INTRAVENOUS | Status: DC | PRN

## 2022-02-16 MED ORDER — ACETAMINOPHEN 500 MG PO TABS
1000.0000 mg | ORAL_TABLET | Freq: Four times a day (QID) | ORAL | Status: DC
  Administered 2022-02-16 – 2022-02-17 (×4): 1000 mg via ORAL
  Filled 2022-02-16 (×4): qty 2

## 2022-02-16 MED ORDER — BUPIVACAINE HCL (PF) 0.5 % IJ SOLN
INTRAMUSCULAR | Status: AC
  Filled 2022-02-16: qty 30

## 2022-02-16 MED ORDER — OXYCODONE HCL 5 MG PO TABS: 5.0000 mg | ORAL_TABLET | Freq: Once | ORAL | Status: AC | PRN

## 2022-02-16 MED ORDER — BISACODYL 5 MG PO TBEC
10.0000 mg | DELAYED_RELEASE_TABLET | Freq: Every day | ORAL | Status: DC
  Filled 2022-02-16: qty 2

## 2022-02-16 MED ORDER — PANTOPRAZOLE SODIUM 40 MG PO TBEC
40.0000 mg | DELAYED_RELEASE_TABLET | Freq: Every day | ORAL | Status: DC
  Administered 2022-02-17: 40 mg via ORAL
  Filled 2022-02-16: qty 1

## 2022-02-16 MED ORDER — BUPIVACAINE LIPOSOME 1.3 % IJ SUSP
INTRAMUSCULAR | Status: AC
  Filled 2022-02-16: qty 20

## 2022-02-16 MED ORDER — CELECOXIB 200 MG PO CAPS
200.0000 mg | ORAL_CAPSULE | Freq: Once | ORAL | Status: AC
  Administered 2022-02-16: 200 mg via ORAL
  Filled 2022-02-16: qty 1

## 2022-02-16 MED ORDER — MIDAZOLAM HCL 2 MG/2ML IJ SOLN
INTRAMUSCULAR | Status: AC
  Filled 2022-02-16: qty 2

## 2022-02-16 MED ORDER — OXYCODONE HCL 5 MG/5ML PO SOLN
5.0000 mg | Freq: Once | ORAL | Status: AC | PRN
  Administered 2022-02-16: 5 mg via ORAL

## 2022-02-16 MED ORDER — 0.9 % SODIUM CHLORIDE (POUR BTL) OPTIME
TOPICAL | Status: DC | PRN
  Administered 2022-02-16: 4000 mL

## 2022-02-16 MED ORDER — PHENYLEPHRINE HCL-NACL 20-0.9 MG/250ML-% IV SOLN
INTRAVENOUS | Status: DC | PRN
  Administered 2022-02-16: 40 ug/min via INTRAVENOUS

## 2022-02-16 SURGICAL SUPPLY — 62 items
BLADE STERNUM SYSTEM 6 (BLADE) IMPLANT
CHLORAPREP W/TINT 26 (MISCELLANEOUS) ×1 IMPLANT
CNTNR URN SCR LID CUP LEK RST (MISCELLANEOUS) ×4 IMPLANT
CONT SPEC 4OZ STRL OR WHT (MISCELLANEOUS) ×4
DEFOGGER SCOPE WARMER CLEARIFY (MISCELLANEOUS) ×1 IMPLANT
DERMABOND ADVANCED .7 DNX12 (GAUZE/BANDAGES/DRESSINGS) ×1 IMPLANT
DERMABOND ADVANCED .7 DNX6 (GAUZE/BANDAGES/DRESSINGS) IMPLANT
DRAIN CHANNEL 19F RND (DRAIN) IMPLANT
DRAIN CONNECTOR BLAKE 1:1 (MISCELLANEOUS) IMPLANT
DRAPE ARM DVNC X/XI (DISPOSABLE) ×4 IMPLANT
DRAPE COLUMN DVNC XI (DISPOSABLE) ×1 IMPLANT
DRAPE CV SPLIT W-CLR ANES SCRN (DRAPES) ×1 IMPLANT
DRAPE DA VINCI XI ARM (DISPOSABLE) ×4
DRAPE DA VINCI XI COLUMN (DISPOSABLE) ×1
DRAPE ORTHO SPLIT 77X108 STRL (DRAPES)
DRAPE SURG ORHT 6 SPLT 77X108 (DRAPES) ×1 IMPLANT
ELECT REM PT RETURN 9FT ADLT (ELECTROSURGICAL) ×1
ELECTRODE REM PT RTRN 9FT ADLT (ELECTROSURGICAL) ×1 IMPLANT
FELT TEFLON 1X6 (MISCELLANEOUS) IMPLANT
GAUZE 4X4 16PLY ~~LOC~~+RFID DBL (SPONGE) IMPLANT
GAUZE KITTNER 4X5 RF (MISCELLANEOUS) ×3 IMPLANT
GAUZE SPONGE 4X4 12PLY STRL (GAUZE/BANDAGES/DRESSINGS) IMPLANT
GLOVE BIO SURGEON STRL SZ7.5 (GLOVE) ×3 IMPLANT
GOWN STRL REUS W/ TWL LRG LVL3 (GOWN DISPOSABLE) ×1 IMPLANT
GOWN STRL REUS W/ TWL XL LVL3 (GOWN DISPOSABLE) ×2 IMPLANT
GOWN STRL REUS W/TWL 2XL LVL3 (GOWN DISPOSABLE) ×1 IMPLANT
GOWN STRL REUS W/TWL LRG LVL3 (GOWN DISPOSABLE) ×1
GOWN STRL REUS W/TWL XL LVL3 (GOWN DISPOSABLE) ×2
HEMOSTAT SURGICEL 2X14 (HEMOSTASIS) ×1 IMPLANT
KIT SUCTION CATH 14FR (SUCTIONS) IMPLANT
NEEDLE HYPO 22GX1.5 SAFETY (NEEDLE) ×1 IMPLANT
PACK CHEST (CUSTOM PROCEDURE TRAY) ×1 IMPLANT
PAD ARMBOARD 7.5X6 YLW CONV (MISCELLANEOUS) ×2 IMPLANT
PAD ELECT DEFIB RADIOL ZOLL (MISCELLANEOUS) ×1 IMPLANT
SEAL CANN UNIV 5-8 DVNC XI (MISCELLANEOUS) ×4 IMPLANT
SEAL XI 5MM-8MM UNIVERSAL (MISCELLANEOUS) ×4
SET TRI-LUMEN FLTR TB AIRSEAL (TUBING) ×1 IMPLANT
SOLUTION ELECTROLUBE (MISCELLANEOUS) ×1 IMPLANT
SPONGE T-LAP 18X18 ~~LOC~~+RFID (SPONGE) ×1 IMPLANT
SPONGE TONSIL 1 RF SGL (DISPOSABLE) ×1 IMPLANT
SUT PDS AB 1 CTX 36 (SUTURE) IMPLANT
SUT SILK  1 MH (SUTURE) ×1
SUT SILK 1 MH (SUTURE) ×1 IMPLANT
SUT SILK 2 0 SH CR/8 (SUTURE) IMPLANT
SUT STEEL 6MS V (SUTURE) IMPLANT
SUT STEEL SZ 6 DBL 3X14 BALL (SUTURE) IMPLANT
SUT VIC AB 2-0 CT1 27 (SUTURE)
SUT VIC AB 2-0 CT1 TAPERPNT 27 (SUTURE) IMPLANT
SUT VIC AB 2-0 CTX 36 (SUTURE) IMPLANT
SUT VIC AB 3-0 SH 27 (SUTURE) ×2
SUT VIC AB 3-0 SH 27X BRD (SUTURE) ×2 IMPLANT
SUT VICRYL 0 TIES 12 18 (SUTURE) ×1 IMPLANT
SUT VICRYL 0 UR6 27IN ABS (SUTURE) ×2 IMPLANT
SYR 20CC LL (SYRINGE) ×1 IMPLANT
SYSTEM RETRIEVAL ANCHOR 8 (MISCELLANEOUS) IMPLANT
SYSTEM SAHARA CHEST DRAIN ATS (WOUND CARE) IMPLANT
TAPE CLOTH SURG 6X10 WHT LF (GAUZE/BANDAGES/DRESSINGS) IMPLANT
TOWEL GREEN STERILE (TOWEL DISPOSABLE) IMPLANT
TOWEL GREEN STERILE FF (TOWEL DISPOSABLE) IMPLANT
TRAY FOLEY SLVR 16FR TEMP STAT (SET/KITS/TRAYS/PACK) IMPLANT
TROCAR PORT AIRSEAL 12X150 (TUBING) IMPLANT
TROCAR PORT AIRSEAL 8X120 (TROCAR) IMPLANT

## 2022-02-16 NOTE — Hospital Course (Addendum)
History of Present Illness:  Brandon Clayton is a 46 yo male with history of anterior mediastinal mass.  This was originally resected back in October of 2022 by Dr. Kipp Brood. Patient recently had a repeat CT of the chest performed in June.  This showed a 11 mm fluid density in the anterior mediastinum which had increase from previous exam.  Due to this he was again referred to Dr. Kipp Brood for surgical evaluation.  He was evaluated on 9/22 at which time patient requested surgical resection due to his history of lipomatous disease.  Review of imaging it was felt this was most likely a cyst.  The risks and benefits of the procedure were explained to the patient and he was agreeable to proceed.  Hospital Course:  Brandon Clayton presented to [facility] on 02/16/2022.  He was taken to the operating room and underwent Robotic Assisted Resection Anterior Mediastinal Mass.  He tolerated the procedure without difficulty, was extubated and taken to the SICU in stable condition  Postoperative hospital course:  The patient has done well.  He is remained hemodynamically stable in sinus rhythm.  Oxygen was weaned without difficulty and has good saturations on room air.  He is having excellent urine output and renal function is within normal limits.  Chest x-ray on postoperative day #1 showed no pneumothorax with clear lung fields and chest tube was removed.  Incisions are noted to be healing well without evidence of infection.  He is noted not to be anemic.  He has a mild reactive leukocytosis.  He is not having any fevers.  He is tolerating routine pulmonary hygiene and activity progression and overall is felt to be stable for discharge.

## 2022-02-16 NOTE — Anesthesia Procedure Notes (Signed)
Procedure Name: Intubation Date/Time: 02/16/2022 12:39 PM  Performed by: Dorann Lodge, CRNAPre-anesthesia Checklist: Patient identified, Emergency Drugs available, Suction available and Patient being monitored Patient Re-evaluated:Patient Re-evaluated prior to induction Oxygen Delivery Method: Circle System Utilized Preoxygenation: Pre-oxygenation with 100% oxygen Induction Type: IV induction Ventilation: Mask ventilation without difficulty Laryngoscope Size: Mac and 4 Grade View: Grade II Endobronchial tube: Left, Double lumen EBT, EBT position confirmed by auscultation and EBT position confirmed by fiberoptic bronchoscope and 39 Fr Number of attempts: 2 Airway Equipment and Method: Stylet and Fiberoptic brochoscope Placement Confirmation: ETT inserted through vocal cords under direct vision, positive ETCO2 and breath sounds checked- equal and bilateral Secured at: 30 cm Tube secured with: Tape Dental Injury: Teeth and Oropharynx as per pre-operative assessment

## 2022-02-16 NOTE — Transfer of Care (Signed)
Immediate Anesthesia Transfer of Care Note  Patient: Brandon Clayton  Procedure(s) Performed: XI ROBOTIC ASSISTED RESECTION OF ANTERIOR MEDIASTINAL MASS/CYST (Right)  Patient Location: PACU  Anesthesia Type:General  Level of Consciousness: awake and drowsy  Airway & Oxygen Therapy: Patient Spontanous Breathing  Post-op Assessment: Report given to RN and Post -op Vital signs reviewed and stable  Post vital signs: Reviewed and stable  Last Vitals:  Vitals Value Taken Time  BP 108/78 02/16/22 1422  Temp    Pulse 65 02/16/22 1426  Resp 10 02/16/22 1426  SpO2 91 % 02/16/22 1426  Vitals shown include unvalidated device data.  Last Pain:  Vitals:   02/16/22 1055  TempSrc: Oral  PainSc:       Patients Stated Pain Goal: 0 (01/56/15 3794)  Complications: No notable events documented.

## 2022-02-16 NOTE — Anesthesia Postprocedure Evaluation (Signed)
Anesthesia Post Note  Patient: Brandon Clayton  Procedure(s) Performed: XI ROBOTIC ASSISTED RESECTION OF ANTERIOR MEDIASTINAL MASS/CYST (Right)     Patient location during evaluation: PACU Anesthesia Type: General Level of consciousness: awake and alert Pain management: pain level controlled Vital Signs Assessment: post-procedure vital signs reviewed and stable Respiratory status: spontaneous breathing, nonlabored ventilation, respiratory function stable and patient connected to nasal cannula oxygen Cardiovascular status: blood pressure returned to baseline and stable Postop Assessment: no apparent nausea or vomiting Anesthetic complications: no   No notable events documented.  Last Vitals:  Vitals:   02/16/22 1510 02/16/22 1525  BP:  107/74  Pulse: 68 62  Resp: 20 10  Temp:  (!) 36.2 C  SpO2: (!) 89% 95%    Last Pain:  Vitals:   02/16/22 1525  TempSrc:   PainSc: 0-No pain                 Audry Pili

## 2022-02-16 NOTE — Interval H&P Note (Signed)
History and Physical Interval Note:  02/16/2022 11:45 AM  Brandon Clayton  has presented today for surgery, with the diagnosis of MEDIASTINAL CYST.  The various methods of treatment have been discussed with the patient and family. After consideration of risks, benefits and other options for treatment, the patient has consented to  Procedure(s): XI ROBOTIC ASSISTED RESECTION OF ANTERIOR MEDIASTINAL MASS/CYST (Right) as a surgical intervention.  The patient's history has been reviewed, patient examined, no change in status, stable for surgery.  I have reviewed the patient's chart and labs.  Questions were answered to the patient's satisfaction.     Jeromy Borcherding Bary Leriche

## 2022-02-16 NOTE — Anesthesia Preprocedure Evaluation (Addendum)
Anesthesia Evaluation  Patient identified by MRN, date of birth, ID band Patient awake    Reviewed: Allergy & Precautions, NPO status , Patient's Chart, lab work & pertinent test results  History of Anesthesia Complications (+) Emergence Delirium and history of anesthetic complications  Airway Mallampati: I  TM Distance: >3 FB Neck ROM: Full    Dental  (+) Dental Advisory Given, Teeth Intact   Pulmonary neg pulmonary ROS,    Pulmonary exam normal        Cardiovascular Normal cardiovascular exam   Pericardial cyst s/p excision with 1.1cm remnant      Neuro/Psych  Neuromuscular disease negative psych ROS   GI/Hepatic negative GI ROS, Neg liver ROS,   Endo/Other  negative endocrine ROS  Renal/GU negative Renal ROS     Musculoskeletal  (+) Arthritis ,   Abdominal   Peds  Hematology negative hematology ROS (+)   Anesthesia Other Findings   Reproductive/Obstetrics                            Anesthesia Physical Anesthesia Plan  ASA: 1  Anesthesia Plan: General   Post-op Pain Management: Tylenol PO (pre-op)* and Celebrex PO (pre-op)*   Induction: Intravenous  PONV Risk Score and Plan: 2 and Treatment may vary due to age or medical condition, Ondansetron, Dexamethasone, Midazolam and Scopolamine patch - Pre-op  Airway Management Planned: Double Lumen EBT  Additional Equipment: ClearSight  Intra-op Plan:   Post-operative Plan: Extubation in OR  Informed Consent: I have reviewed the patients History and Physical, chart, labs and discussed the procedure including the risks, benefits and alternatives for the proposed anesthesia with the patient or authorized representative who has indicated his/her understanding and acceptance.     Dental advisory given  Plan Discussed with: CRNA and Anesthesiologist  Anesthesia Plan Comments:        Anesthesia Quick Evaluation

## 2022-02-16 NOTE — Op Note (Signed)
      [address]       ,[place] [address]             [phone]        02/16/2022  Patient:  Brandon Clayton Pre-Op Dx: pericardial cyst   Post-op Dx:  same Procedure: - Robotic assisted right video thoracoscopy - Resection of thymic remnant - Intercostal nerve block  Surgeon and Role:      * Audon Heymann, Lucile Crater, MD - Primary  Assistant: Evonnie Pat, PA-C  Anesthesia  general EBL:  34m Blood Administration: none Specimen:  thymic tissue  Drains: 19 F chest tube in right chest Counts: correct   Indications: 46year old male with a pericardial cyst.  He previously underwent a right robotic assisted thoracoscopy with excision of his anterior mediastinal tissue.  On review of the cross-sectional imaging that the cyst does not appear to be below with the pericardium.  It is 1.1 cm in size.  He would like this removed.  He is agreeable to undergo another right robotic assisted thoracoscopy with excision of the pericardial cyst.  Findings: Small thymic remnant along the left side of the pericardium.  This was medialized, and resected.  Nodularity noted on palpation.  Operative Technique: After the risks, benefits and alternatives were thoroughly discussed, the patient was brought to the operative theatre.  Anesthesia was induced, and the patient was then placed in a left lazy lateral decubitus position and was prepped and draped in normal sterile fashion.  An appropriate surgical pause was performed, and pre-operative antibiotics were dosed accordingly.   We began by placing our 3 robotic ports in the the intercostal spaces targeting the pericardium.  The robot was then docked and all instruments were passed under direct visualization.    The anterior mediastinal fat was visualized, we began by mobilizing this tissue off of the pericardium anterior to the phrenic nerve.  With a combination of LigaSure cautery and blunt dissection we mobilized the thymic  remnant all the way up to the point of the neck.  We then dissected all of the pericardial fat down to the level of the diaphragm.  We then began to mobilize all of this tissue off of the undersurface of the sternum and made her way towards the left pleural space.  The left pleural space was entered.  The thymic tissue was then placed in an Endo Catch bag and removed from the inferior most port.   A 19 [place] drain was passed through right inferior robotic port into the pericardium.  An intercostal nerve block was performed under direct visualization.  The skin and soft tissue were closed with absorbable suture     The patient tolerated the procedure without any immediate complications, and was transferred to the PACU in stable condition  Jalesia Loudenslager O Taesha Goodell

## 2022-02-16 NOTE — Brief Op Note (Signed)
02/16/2022  1:51 PM  PATIENT:  Brandon Clayton  46 y.o. male  PRE-OPERATIVE DIAGNOSIS:  MEDIASTINAL CYST  POST-OPERATIVE DIAGNOSIS:  MEDIASTINAL CYST  PROCEDURE:  Procedure(s): XI ROBOTIC ASSISTED RESECTION OF ANTERIOR MEDIASTINAL MASS/CYST (Right)  SURGEON:  Surgeon(s) and Role:    * Lightfoot, Lucile Crater, MD - Primary  PHYSICIAN ASSISTANT: Ladaija Dimino PA-C   ASSISTANTS: none   ANESTHESIA:   general  EBL:  10 ml  BLOOD ADMINISTERED:none  DRAINS:  Chest tube    LOCAL MEDICATIONS USED:  BUPIVICAINE   SPECIMEN:  Source of Specimen:    Thymic Fat, Cyst  DISPOSITION OF SPECIMEN:  PATHOLOGY  COUNTS:  YES  TOURNIQUET:  * No tourniquets in log *  DICTATION: .Dragon Dictation  PLAN OF CARE: Admit for overnight observation  PATIENT DISPOSITION:  PACU - hemodynamically stable.   Delay start of Pharmacological VTE agent (>24hrs) due to surgical blood loss or risk of bleeding: no

## 2022-02-17 ENCOUNTER — Telehealth: Payer: Self-pay

## 2022-02-17 ENCOUNTER — Inpatient Hospital Stay (HOSPITAL_COMMUNITY): Payer: Federal, State, Local not specified - PPO

## 2022-02-17 LAB — BASIC METABOLIC PANEL
Anion gap: 8 (ref 5–15)
BUN: 9 mg/dL (ref 6–20)
CO2: 22 mmol/L (ref 22–32)
Calcium: 9 mg/dL (ref 8.9–10.3)
Chloride: 107 mmol/L (ref 98–111)
Creatinine, Ser: 1.04 mg/dL (ref 0.61–1.24)
GFR, Estimated: >60 mL/min (ref >60)
Glucose, Bld: 163 mg/dL — ABNORMAL HIGH (ref 70–99)
Potassium: 4.3 mmol/L (ref 3.5–5.1)
Sodium: 137 mmol/L (ref 135–145)

## 2022-02-17 LAB — CBC
HCT: 40.7 % (ref 39.0–52.0)
Hemoglobin: 14.2 g/dL (ref 13.0–17.0)
MCH: 32.2 pg (ref 26.0–34.0)
MCHC: 34.9 g/dL (ref 30.0–36.0)
MCV: 92.3 fL (ref 80.0–100.0)
Platelets: 321 10*3/uL (ref 150–400)
RBC: 4.41 MIL/uL (ref 4.22–5.81)
RDW: 11.9 % (ref 11.5–15.5)
WBC: 13.4 10*3/uL — ABNORMAL HIGH (ref 4.0–10.5)
nRBC: 0 % (ref 0.0–0.2)

## 2022-02-17 LAB — SURGICAL PATHOLOGY

## 2022-02-17 MED ORDER — TRAMADOL HCL 50 MG PO TABS: 50.0000 mg | ORAL_TABLET | Freq: Four times a day (QID) | ORAL | 0 refills | Status: AC | PRN

## 2022-02-17 NOTE — Progress Notes (Signed)
Mobility Specialist Progress Note    02/17/22 1208  Mobility  Activity Ambulated independently in hallway  Activity Response Tolerated well  Distance Ambulated (ft) 420 ft  $Mobility charge 1 Mobility  Level of Assistance Independent  Assistive Device None   Pre-Mobility: 68 HR, 96% SpO2  Pt received in bed and agreeable. No complaints on walk. Returned to room with call bell in reach.    Hildred Alamin Mobility Specialist

## 2022-02-17 NOTE — Progress Notes (Signed)
[address]       [place] [address]             [phone]      1 Day Post-Op Procedure(s) (LRB): XI ROBOTIC ASSISTED RESECTION OF ANTERIOR MEDIASTINAL MASS/CYST (Right) Subjective: Feels well, pain well controlled, not short of breath  Objective: Vital signs in last 24 hours: Temp:  [97.2 F (36.2 C)-98.9 F (37.2 C)] 98.8 F (37.1 C) (10/05 0354) Pulse Rate:  [56-85] 85 (10/05 0354) Cardiac Rhythm: Normal sinus rhythm;Heart block (10/04 1858) Resp:  [10-20] 18 (10/05 0354) BP: (89-133)/(61-83) 107/65 (10/05 0354) SpO2:  [89 %-100 %] 95 % (10/05 0354) Weight:  [84.8 kg] 84.8 kg (10/04 1055)  Hemodynamic parameters for last 24 hours:    Intake/Output from previous day: 10/04 0701 - 10/05 0700 In: 1300.9 [I.V.:1100; IV Piggyback:200.9] Out: 2453 [Urine:2325; Blood:10; Chest Tube:118] Intake/Output this shift: No intake/output data recorded.  General appearance: alert, cooperative, and no distress Heart: regular rate and rhythm Lungs: clear to auscultation bilaterally Abdomen: Benign exam Extremities: No edema, warm, well-perfused Wound: Incisions healing well without evidence of infection  Lab Results: Recent Labs    02/14/22 1500 02/17/22 0022  WBC 7.8 13.4*  HGB 15.5 14.2  HCT 45.3 40.7  PLT 335 321   BMET:  Recent Labs    02/14/22 1500 02/17/22 0022  NA 139 137  K 4.2 4.3  CL 108 107  CO2 26 22  GLUCOSE 91 163*  BUN 11 9  CREATININE 1.10 1.04  CALCIUM 9.4 9.0    PT/INR:  Recent Labs    02/14/22 1500  LABPROT 13.4  INR 1.0   ABG    Component Value Date/Time   PHART 7.413 02/22/2021 1355   HCO3 22.6 02/22/2021 1355   ACIDBASEDEF 1.4 02/22/2021 1355   O2SAT 98.6 02/22/2021 1355   CBG (last 3)  No results for input(s): "GLUCAP" in the last 72 hours.  Meds Scheduled Meds:  acetaminophen  1,000 mg Oral Q6H   Or   acetaminophen (TYLENOL) oral liquid 160 mg/5 mL  1,000 mg Oral Q6H   bisacodyl  10 mg Oral  Daily   enoxaparin (LOVENOX) injection  40 mg Subcutaneous Daily   ketorolac  15 mg Intravenous Q6H   pantoprazole  40 mg Oral Daily   senna-docusate  1 tablet Oral QHS   Continuous Infusions: PRN Meds:.ondansetron (ZOFRAN) IV, traMADol  Xrays CT CHEST WO CONTRAST  Result Date: 02/16/2022 CLINICAL DATA:  Mediastinal mass, pericardial cyst EXAM: CT CHEST WITHOUT CONTRAST TECHNIQUE: Multidetector CT imaging of the chest was performed following the standard protocol without IV contrast. RADIATION DOSE REDUCTION: This exam was performed according to the departmental dose-optimization program which includes automated exposure control, adjustment of the mA and/or kV according to patient size and/or use of iterative reconstruction technique. COMPARISON:  11/04/2021 FINDINGS: Cardiovascular: Heart is normal in size.  No pericardial effusion. No evidence of thoracic aortic aneurysm. Mediastinum/Nodes: Prior anterior mediastinal cyst has been surgically removed. Lungs/Pleura: Mild patchy bilateral lower lobe atelectasis. No suspicious pulmonary nodules. Small medial right pneumothorax along the anterior pleural space (series 5/image 48) with indwelling right chest tube. Upper Abdomen: Visualized upper abdomen is grossly unremarkable. Musculoskeletal: Postsurgical changes with soft tissue gas in the right anterior chest (series 3/image 28). Mild degenerative changes of the visualized thoracolumbar spine. IMPRESSION: Postsurgical changes related to anterior mediastinal cyst removal. Small medial right pneumothorax with indwelling right chest tube, as above. Electronically Signed  By: Julian Hy M.D.   On: 02/16/2022 20:26   DG Chest Port 1 View  Result Date: 02/16/2022 CLINICAL DATA:  Robot assisted surgical procedure. Resection of mediastinal cyst. EXAM: PORTABLE CHEST 1 VIEW COMPARISON:  Two-view chest x-ray 02/14/2022 FINDINGS: Heart size exaggerated by low lung volumes. Right-sided chest tube is in  place without pneumothorax. Bibasilar atelectasis is worse on the left. Mild pulmonary vascular congestion is present. Gaseous distention of the stomach is noted. IMPRESSION: 1. Right-sided chest tube without pneumothorax. 2. Bibasilar atelectasis is worse on the left. 3. Mild pulmonary vascular congestion. 4. Gaseous distention of the stomach. Electronically Signed   By: San Morelle M.D.   On: 02/16/2022 15:31    Assessment/Plan: S/P Procedure(s) (LRB): XI ROBOTIC ASSISTED RESECTION OF ANTERIOR MEDIASTINAL MASS/CYST (Right) POD#1  1 afeb, VSS s BP 89- 133, sinus rhythm 2 sats good on RA 3 excellent UOP 4 CT 118 cc-we will remove this morning 5 CXR-lung fields appear clear, no pneumothorax 6 normal renal function 7 mild reactive leukocytosis with WBC 13.4 8 not anemic 9 routine pulmonary hygiene and activity progression 10 probable home later today     LOS: 1 day    John Giovanni PA-C Pager [phone] 02/17/2022

## 2022-02-17 NOTE — TOC Transition Note (Signed)
Transition of Care [facility]) - CM/SW Discharge Note   Patient Details  Name: Brandon Clayton MRN: [id number] Date of Birth: 03/10/1976  [facility] CM/SW Contact:  Angelita Ingles, RN Phone Number:[phone]  02/17/2022, 12:46 PM   Clinical Narrative:    Patient with discharge orders . No TOC needs noted.         Patient Goals and CMS Choice        Discharge Placement                       Discharge Plan and Services                                     Social Determinants of Health (SDOH) Interventions     Readmission Risk Interventions     No data to display

## 2022-02-17 NOTE — Telephone Encounter (Signed)
Patient's wife, Margarita Grizzle contacted the office requesting change in patient's pain medication. He was discharged from the hospital today and states that while he was in the hospital, he needed more medication than what was prescribed at discharged. She states he has said that it "feels like I've been kicked in the chest by a horse". Advised that patient needed to take the medication as prescribed with Tylenol for pain for the first few days to see if it will help. She states that he took the medication two hours ago with little relief. Advised that if the pain does not improve over the next few days, to give our office a call back. She acknowledged receipt.

## 2022-02-17 NOTE — Discharge Summary (Signed)
Physician Discharge Summary  Patient ID: Brandon Clayton MRN: [id number] DOB/AGE: 1975-12-11 46 y.o.  Admit date: 02/16/2022 Discharge date: 02/17/2022  Admission Diagnoses:  Discharge Diagnoses:  Principal Problem:   S/P robot-assisted surgical procedure   Discharged Condition: good  History of Present Illness:  Brandon Clayton is a 46 yo male with history of anterior mediastinal mass.  This was originally resected back in October of 2022 by Dr. Kipp Clayton. Patient recently had a repeat CT of the chest performed in June.  This showed a 11 mm fluid density in the anterior mediastinum which had increase from previous exam.  Due to this he was again referred to Dr. Kipp Clayton for surgical evaluation.  He was evaluated on 9/22 at which time patient requested surgical resection due to his history of lipomatous disease.  Review of imaging it was felt this was most likely a cyst.  The risks and benefits of the procedure were explained to the patient and he was agreeable to proceed.  Hospital Course:  Brandon Clayton presented to [facility] on 02/16/2022.  He was taken to the operating room and underwent Robotic Assisted Resection Anterior Mediastinal Mass.  He tolerated the procedure without difficulty, was extubated and taken to the SICU in stable condition  Postoperative hospital course:  The patient has done well.  He is remained hemodynamically stable in sinus rhythm.  Oxygen was weaned without difficulty and has good saturations on room air.  He is having excellent urine output and renal function is within normal limits.  Chest x-ray on postoperative day #1 showed no pneumothorax with clear lung fields and chest tube was removed.  Incisions are noted to be healing well without evidence of infection.  He is noted not to be anemic.  He has a mild reactive leukocytosis.  He is not having any fevers.  He is tolerating routine pulmonary hygiene and activity progression and overall  is felt to be stable for discharge.  Consults: None  Significant Diagnostic Studies:  DG Chest Port 1 View  Result Date: 02/17/2022 CLINICAL DATA:  Lung surgery. EXAM: PORTABLE CHEST 1 VIEW COMPARISON:  02/16/2022 FINDINGS: Low volume film. Right chest tube evident without findings for right-sided pneumothorax. Minimal basilar atelectasis again noted. Cardiopericardial silhouette is at upper limits of normal for size. Telemetry leads overlie the chest. IMPRESSION: Low volume film with basilar atelectasis. Tiny medial right-sided pneumothorax seen on yesterday's chest CT is not readily evident on x-ray today. Electronically Signed   By: Misty Stanley M.D.   On: 02/17/2022 08:07   CT CHEST WO CONTRAST  Result Date: 02/16/2022 CLINICAL DATA:  Mediastinal mass, pericardial cyst EXAM: CT CHEST WITHOUT CONTRAST TECHNIQUE: Multidetector CT imaging of the chest was performed following the standard protocol without IV contrast. RADIATION DOSE REDUCTION: This exam was performed according to the departmental dose-optimization program which includes automated exposure control, adjustment of the mA and/or kV according to patient size and/or use of iterative reconstruction technique. COMPARISON:  11/04/2021 FINDINGS: Cardiovascular: Heart is normal in size.  No pericardial effusion. No evidence of thoracic aortic aneurysm. Mediastinum/Nodes: Prior anterior mediastinal cyst has been surgically removed. Lungs/Pleura: Mild patchy bilateral lower lobe atelectasis. No suspicious pulmonary nodules. Small medial right pneumothorax along the anterior pleural space (series 5/image 48) with indwelling right chest tube. Upper Abdomen: Visualized upper abdomen is grossly unremarkable. Musculoskeletal: Postsurgical changes with soft tissue gas in the right anterior chest (series 3/image 28). Mild degenerative changes of the visualized thoracolumbar spine. IMPRESSION: Postsurgical changes related  to anterior mediastinal cyst  removal. Small medial right pneumothorax with indwelling right chest tube, as above. Electronically Signed   By: Julian Hy M.D.   On: 02/16/2022 20:26   DG Chest Port 1 View  Result Date: 02/16/2022 CLINICAL DATA:  Robot assisted surgical procedure. Resection of mediastinal cyst. EXAM: PORTABLE CHEST 1 VIEW COMPARISON:  Two-view chest x-ray 02/14/2022 FINDINGS: Heart size exaggerated by low lung volumes. Right-sided chest tube is in place without pneumothorax. Bibasilar atelectasis is worse on the left. Mild pulmonary vascular congestion is present. Gaseous distention of the stomach is noted. IMPRESSION: 1. Right-sided chest tube without pneumothorax. 2. Bibasilar atelectasis is worse on the left. 3. Mild pulmonary vascular congestion. 4. Gaseous distention of the stomach. Electronically Signed   By: San Morelle M.D.   On: 02/16/2022 15:31      Results for orders placed or performed during the hospital encounter of 02/16/22 (from the past 48 hour(s))  CBC     Status: Abnormal   Collection Time: 02/17/22 12:22 AM  Result Value Ref Range   WBC 13.4 (H) 4.0 - 10.5 K/uL   RBC 4.41 4.22 - 5.81 MIL/uL   Hemoglobin 14.2 13.0 - 17.0 g/dL   HCT 40.7 39.0 - 52.0 %   MCV 92.3 80.0 - 100.0 fL   MCH 32.2 26.0 - 34.0 pg   MCHC 34.9 30.0 - 36.0 g/dL   RDW 11.9 11.5 - 15.5 %   Platelets 321 150 - 400 K/uL   nRBC 0.0 0.0 - 0.2 %    Comment: Performed at [facility], [place] [address], [place], [place] [address]  Basic metabolic panel     Status: Abnormal   Collection Time: 02/17/22 12:22 AM  Result Value Ref Range   Sodium 137 135 - 145 mmol/L   Potassium 4.3 3.5 - 5.1 mmol/L   Chloride 107 98 - 111 mmol/L   CO2 22 22 - 32 mmol/L   Glucose, Bld 163 (H) 70 - 99 mg/dL    Comment: Glucose reference range applies only to samples taken after fasting for at least 8 hours.   BUN 9 6 - 20 mg/dL   Creatinine, Ser 1.04 0.61 - 1.24 mg/dL   Calcium 9.0 8.9 - 10.3 mg/dL   GFR, Estimated >60  >60 mL/min    Comment: (NOTE) Calculated using the CKD-EPI Creatinine Equation (2021)    Anion gap 8 5 - 15    Comment: Performed at [facility] [address], [place], [place] [address]    Treatments: surgery:   02/16/2022   Patient:  [name] Pre-Op Dx: pericardial cyst   Post-op Dx:  same Procedure: - Robotic assisted right video thoracoscopy - Resection of thymic remnant - Intercostal nerve block   Surgeon and Role:      * Lightfoot, Lucile Crater, MD - Primary   Assistant: Evonnie Pat, PA-C  Anesthesia  general   PATHOLOGY: Pending  Discharge Exam: Blood pressure 113/75, pulse 94, temperature 98.1 F (36.7 C), temperature source Oral, resp. rate 18, height '5\' 9"'$  (1.753 m), weight 84.8 kg, SpO2 96 %.   General appearance: alert, cooperative, and no distress Heart: regular rate and rhythm Lungs: clear to auscultation bilaterally Abdomen: Benign exam Extremities: No edema, warm, well-perfused Wound: Incisions healing well without evidence of infection   Disposition: Discharge disposition: 01-Home or Self Care       Discharge Instructions     Discharge patient   Complete by: As  directed    Discharge disposition: 01-Home or Self Care   Discharge patient date: 02/17/2022      Allergies as of 02/17/2022   No Known Allergies      Medication List     TAKE these medications    traMADol 50 MG tablet Commonly known as: ULTRAM Take 1 tablet (50 mg total) by mouth every 6 (six) hours as needed for up to 7 days (mild pain).         Signed: John Giovanni, PA-C  02/17/2022, 11:51 AM

## 2022-02-21 ENCOUNTER — Telehealth: Payer: Self-pay | Admitting: *Deleted

## 2022-02-21 NOTE — Telephone Encounter (Signed)
Patient's wife, Margarita Grizzle, contacted the office requesting a stronger pain medication stating patient feels like something is being "ripped out". Patient is s/p robotic removal of anterior mediastinal mass/ cyst 10/4 by Dr. Kipp Brood. Wife denies fevers or issues with incisions. Per E. Barrett, PA, wife told stronger medication will not be prescribed at this time. Advised to incorporate Ibuprofen into pain regimen. Advised not to exceed more than '800mg'$  a day. Wife acknowledges receipt.

## 2022-02-28 NOTE — Progress Notes (Signed)
      [address]       [place] [address]             [phone]        Brandon Clayton [facility] Medical Record #[id number] Date of Birth: [birth year]  Referring: Berniece Salines, DO Primary Care: Marin Olp, MD Primary Cardiologist:Kardie Tobb, DO  Reason for visit:   follow-up  History of Present Illness:     46 year old male presents for his first follow-up appointment after undergoing a redo resection of a mediastinal mass.  Physical Exam: BP 120/80   Pulse 90   Resp 20   Ht '5\' 9"'$  (1.753 m)   Wt 184 lb (83.5 kg)   SpO2 98% Comment: RA  BMI 27.17 kg/m   Alert NAD Incision clean.   Abdomen, ND No peripheral edema   Diagnostic Studies & Laboratory data:  Path: FINAL MICROSCOPIC DIAGNOSIS:   A. SOFT TISSUE, MEDIASTINAL, EXCISION:  - Mature adipose tissue, see comment     Assessment / Plan:   46 year old male status post redo robotic assisted thoracoscopy and resection of mediastinal mass remnant.  Pathology was consistent with mature adipose tissue.  He will follow-up in 1 month with a chest x-ray.     Lajuana Matte 03/04/2022 4:04 PM

## 2022-03-03 ENCOUNTER — Ambulatory Visit (INDEPENDENT_AMBULATORY_CARE_PROVIDER_SITE_OTHER): Payer: Self-pay | Admitting: Thoracic Surgery (Cardiothoracic Vascular Surgery)

## 2022-03-03 VITALS — BP 120/80 | HR 90 | Resp 20 | Ht 69.0 in | Wt 184.0 lb

## 2022-03-03 DIAGNOSIS — Z09 Encounter for follow-up examination after completed treatment for conditions other than malignant neoplasm: Secondary | ICD-10-CM

## 2022-03-03 DIAGNOSIS — Q248 Other specified congenital malformations of heart: Secondary | ICD-10-CM

## 2022-03-10 ENCOUNTER — Ambulatory Visit (AMBULATORY_SURGERY_CENTER): Payer: Federal, State, Local not specified - PPO | Admitting: Internal Medicine

## 2022-03-10 ENCOUNTER — Encounter: Payer: Self-pay | Admitting: Internal Medicine

## 2022-03-10 VITALS — BP 98/64 | HR 66 | Temp 97.3°F | Resp 13 | Ht 69.0 in | Wt 194.0 lb

## 2022-03-10 DIAGNOSIS — D125 Benign neoplasm of sigmoid colon: Secondary | ICD-10-CM

## 2022-03-10 DIAGNOSIS — D124 Benign neoplasm of descending colon: Secondary | ICD-10-CM

## 2022-03-10 DIAGNOSIS — Z1211 Encounter for screening for malignant neoplasm of colon: Secondary | ICD-10-CM

## 2022-03-10 MED ORDER — SODIUM CHLORIDE 0.9 % IV SOLN: 500.0000 mL | Freq: Once | INTRAVENOUS | Status: DC

## 2022-03-10 NOTE — Patient Instructions (Addendum)
Handout on polyps and hemorrhoids provided.  Await pathology results.  YOU HAD AN ENDOSCOPIC PROCEDURE TODAY AT [facility]:   Refer to the procedure report that was given to you for any specific questions about what was found during the examination.  If the procedure report does not answer your questions, please call your gastroenterologist to clarify.  If you requested that your care partner not be given the details of your procedure findings, then the procedure report has been included in a sealed envelope for you to review at your convenience later.  YOU SHOULD EXPECT: Some feelings of bloating in the abdomen. Passage of more gas than usual.  Walking can help get rid of the air that was put into your GI tract during the procedure and reduce the bloating. If you had a lower endoscopy (such as a colonoscopy or flexible sigmoidoscopy) you may notice spotting of blood in your stool or on the toilet paper. If you underwent a bowel prep for your procedure, you may not have a normal bowel movement for a few days.  Please Note:  You might notice some irritation and congestion in your nose or some drainage.  This is from the oxygen used during your procedure.  There is no need for concern and it should clear up in a day or so.  SYMPTOMS TO REPORT IMMEDIATELY:  Following lower endoscopy (colonoscopy or flexible sigmoidoscopy):  Excessive amounts of blood in the stool  Significant tenderness or worsening of abdominal pains  Swelling of the abdomen that is new, acute  Fever of 100F or higher   For urgent or emergent issues, a gastroenterologist can be reached at any hour by calling [phone]. Do not use MyChart messaging for urgent concerns.    DIET:  We do recommend a small meal at first, but then you may proceed to your regular diet.  Drink plenty of fluids but you should avoid alcoholic beverages for 24 hours.  ACTIVITY:  You should plan to take it easy for the rest of  today and you should NOT DRIVE or use heavy machinery until tomorrow (because of the sedation medicines used during the test).    FOLLOW UP: Our staff will call the number listed on your records the next business day following your procedure.  We will call around 7:15- 8:00 am to check on you and address any questions or concerns that you may have regarding the information given to you following your procedure. If we do not reach you, we will leave a message.     If any biopsies were taken you will be contacted by phone or by letter within the next 1-3 weeks.  Please call us at [phone] if you have not heard about the biopsies in 3 weeks.    SIGNATURES/CONFIDENTIALITY: You and/or your care partner have signed paperwork which will be entered into your electronic medical record.  These signatures attest to the fact that that the information above on your After Visit Summary has been reviewed and is understood.  Full responsibility of the confidentiality of this discharge information lies with you and/or your care-partner.

## 2022-03-10 NOTE — Progress Notes (Signed)
Report to PACU, RN, vss, BBS= Clear.  
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
## 2022-03-10 NOTE — Progress Notes (Signed)
Called to room to assist during endoscopic procedure.  Patient ID and intended procedure confirmed with present staff. Received instructions for my participation in the procedure from the performing physician.  

## 2022-03-10 NOTE — Op Note (Signed)
[facility] Patient Name: Brandon Clayton Procedure Date: 03/10/2022 8:15 AM MRN: [id number] Endoscopist: Docia Chuck. Henrene Pastor , MD, [id number] Age: 46 Referring MD:  Date of Birth: 13-May-1976 Gender: Male Account #: [id number] Procedure:                Colonoscopy with cold snare polypectomy x 2 Indications:              Screening for colorectal malignant neoplasm Medicines:                Monitored Anesthesia Care Procedure:                Pre-Anesthesia Assessment:                           - Prior to the procedure, a History and Physical                            was performed, and patient medications and                            allergies were reviewed. The patient's tolerance of                            previous anesthesia was also reviewed. The risks                            and benefits of the procedure and the sedation                            options and risks were discussed with the patient.                            All questions were answered, and informed consent                            was obtained. Prior Anticoagulants: The patient has                            taken no anticoagulant or antiplatelet agents. ASA                            Grade Assessment: I - A normal, healthy patient.                            After reviewing the risks and benefits, the patient                            was deemed in satisfactory condition to undergo the                            procedure.                           After obtaining informed consent, the colonoscope  was passed under direct vision. Throughout the                            procedure, the patient's blood pressure, pulse, and                            oxygen saturations were monitored continuously. The                            CF [id number] #[id number] was introduced through the anus                            and advanced to the the cecum, identified by                             appendiceal orifice and ileocecal valve. The                            ileocecal valve, appendiceal orifice, and rectum                            were photographed. The quality of the bowel                            preparation was excellent. The colonoscopy was                            performed without difficulty. The patient tolerated                            the procedure well. The bowel preparation used was                            SUPREP via split dose instruction. Scope In: 8:29:10 AM Scope Out: 8:43:48 AM Scope Withdrawal Time: 0 hours 11 minutes 45 seconds  Total Procedure Duration: 0 hours 14 minutes 38 seconds  Findings:                 Two polyps were found in the sigmoid colon and                            descending colon. The polyps were 1 to 3 mm in                            size. These polyps were removed with a cold snare.                            Resection and retrieval were complete.                           Internal hemorrhoids were found during                            retroflexion. The hemorrhoids were small.  The exam was otherwise without abnormality on                            direct and retroflexion views. Complications:            No immediate complications. Estimated blood loss:                            None. Estimated Blood Loss:     Estimated blood loss: none. Impression:               - Two 1 to 3 mm polyps in the sigmoid colon and in                            the descending colon, removed with a cold snare.                            Resected and retrieved.                           - Internal hemorrhoids.                           - The examination was otherwise normal on direct                            and retroflexion views. Recommendation:           - Repeat colonoscopy in 7-10 years for surveillance.                           - Patient has a contact number available for                             emergencies. The signs and symptoms of potential                            delayed complications were discussed with the                            patient. Return to normal activities tomorrow.                            Written discharge instructions were provided to the                            patient.                           - Resume previous diet.                           - Continue present medications.                           - Await pathology results. Docia Chuck. Henrene Pastor, MD 03/10/2022 8:55:41 AM This report has been signed electronically.

## 2022-03-11 ENCOUNTER — Telehealth: Payer: Self-pay

## 2022-03-11 NOTE — Telephone Encounter (Signed)
  Follow up Call-     03/10/2022    7:06 AM  Call back number  Post procedure Call Back phone  # [phone]  Permission to leave phone message Yes     Patient questions:  Do you have a fever, pain , or abdominal swelling? No. Pain Score  0 *  Have you tolerated food without any problems? Yes.    Have you been able to return to your normal activities? Yes.    Do you have any questions about your discharge instructions: Diet   No. Medications  No. Follow up visit  No.  Do you have questions or concerns about your Care? No.  Actions: * If pain score is 4 or above: No action needed, pain <4.

## 2022-03-14 ENCOUNTER — Encounter: Payer: Self-pay | Admitting: Internal Medicine

## 2022-03-17 NOTE — Progress Notes (Signed)
Patient contacted the office stating that he was currently at work and needed a work release note in order to work today. Note faxed per patient's request. Dr. Kipp Brood saw patient in office 03/03/22 following RATS mediastinal mass cyst removal.

## 2022-03-31 ENCOUNTER — Other Ambulatory Visit: Payer: Self-pay | Admitting: Thoracic Surgery (Cardiothoracic Vascular Surgery)

## 2022-03-31 DIAGNOSIS — J9859 Other diseases of mediastinum, not elsewhere classified: Secondary | ICD-10-CM

## 2022-04-01 ENCOUNTER — Ambulatory Visit (INDEPENDENT_AMBULATORY_CARE_PROVIDER_SITE_OTHER): Payer: Self-pay | Admitting: Thoracic Surgery (Cardiothoracic Vascular Surgery)

## 2022-04-01 ENCOUNTER — Ambulatory Visit
Admission: RE | Admit: 2022-04-01 | Discharge: 2022-04-01 | Disposition: A | Discharge status: Home / Self Care | Payer: Federal, State, Local not specified - PPO | Source: Ambulatory Visit | Attending: Thoracic Surgery (Cardiothoracic Vascular Surgery) | Admitting: Thoracic Surgery (Cardiothoracic Vascular Surgery)

## 2022-04-01 VITALS — BP 125/88 | HR 75 | Resp 20 | Ht 69.0 in | Wt 189.0 lb

## 2022-04-01 DIAGNOSIS — R918 Other nonspecific abnormal finding of lung field: Secondary | ICD-10-CM | POA: Diagnosis not present

## 2022-04-01 DIAGNOSIS — Q248 Other specified congenital malformations of heart: Secondary | ICD-10-CM

## 2022-04-01 DIAGNOSIS — Z9081 Acquired absence of spleen: Secondary | ICD-10-CM | POA: Diagnosis not present

## 2022-04-01 DIAGNOSIS — Z09 Encounter for follow-up examination after completed treatment for conditions other than malignant neoplasm: Secondary | ICD-10-CM

## 2022-04-01 DIAGNOSIS — J9859 Other diseases of mediastinum, not elsewhere classified: Secondary | ICD-10-CM

## 2022-04-02 NOTE — Progress Notes (Signed)
      [address]       [place] [address]             [phone]        [place] Edward Schepers III [facility] Medical Record #[id number] Date of Birth: 04/02/1976  Referring: Berniece Salines, DO Primary Care: Marin Olp, MD Primary Cardiologist:Kardie Tobb, DO  Reason for visit:   follow-up  History of Present Illness:     46 year old male presents for his 1 month follow-up appointment.  He has no complaints.  Physical Exam: BP 125/88   Pulse 75   Resp 20   Ht '5\' 9"'$  (1.753 m)   Wt 189 lb (85.7 kg)   SpO2 97%   BMI 27.91 kg/m   Alert NAD Incision clean. Abdomen, ND No peripheral edema   Diagnostic Studies & Laboratory data: CXR: Clear     Assessment / Plan:   46 year old male status post redo robotic assisted thoracoscopy with resection of mediastinal fat pad.  Pathology was negative for malignancy.  He will follow-up as needed.   Lajuana Matte 04/02/2022 5:44 PM

## 2022-06-21 ENCOUNTER — Telehealth: Payer: Self-pay

## 2022-06-21 NOTE — Patient Instructions (Signed)
Visit Information  Thank you for taking time to visit with me today. Please don't hesitate to contact me if I can be of assistance to you.   Following are the goals we discussed today:   Goals Addressed             This Visit's Progress    COMPLETED: Care Coordination Activities-No follow up required       Interventions Today    Flowsheet Row Most Recent Value  General Interventions   General Interventions Discussed/Reviewed General Interventions Discussed, Health Screening, Doctor Visits  Doctor Visits Discussed/Reviewed Annual Wellness Visits  Health Screening Colonoscopy              If you are experiencing a Mental Health or [place] or need someone to talk to, please call the Suicide and Crisis Lifeline: 988   Patient verbalizes understanding of instructions and care plan provided today and agrees to view in [facility]. Active MyChart status and patient understanding of how to access instructions and care plan via MyChart confirmed with patient.     No further follow up required: decline  Jone Baseman, RN, MSN [facility] Care Management Coordinator Direct Line [phone]

## 2022-06-21 NOTE — Patient Outreach (Signed)
  Care Coordination   Initial Visit Note   06/21/2022 Name: Brandon Clayton MRN: [id number] DOB: Jun 01, 1975  Brandon Clayton is a 47 y.o. year old male who sees Yong Channel, Brayton Mars, MD for primary care. I spoke with  Brandon Clayton by phone today.  What matters to the patients health and wellness today?  none    Goals Addressed             This Visit's Progress    COMPLETED: Care Coordination Activities-No follow up required       Interventions Today    Flowsheet Row Most Recent Value  General Interventions   General Interventions Discussed/Reviewed General Interventions Discussed, Health Screening, Doctor Visits  Doctor Visits Discussed/Reviewed Annual Wellness Visits  Health Screening Colonoscopy              SDOH assessments and interventions completed:  Yes  SDOH Interventions Today    Flowsheet Row Most Recent Value  SDOH Interventions   Food Insecurity Interventions Intervention Not Indicated  Housing Interventions Intervention Not Indicated        Care Coordination Interventions:  Yes, provided   Follow up plan: No further intervention required.   Encounter Outcome:  Pt. Visit Completed   Jone Baseman, RN, MSN [facility] Care Management Coordinator Direct Line [phone]

## 2022-08-19 ENCOUNTER — Encounter: Payer: Self-pay | Admitting: Family Medicine

## 2022-08-19 ENCOUNTER — Ambulatory Visit (INDEPENDENT_AMBULATORY_CARE_PROVIDER_SITE_OTHER)
Admission: RE | Admit: 2022-08-19 | Discharge: 2022-08-19 | Disposition: A | Discharge status: Home / Self Care | Payer: Federal, State, Local not specified - PPO | Source: Ambulatory Visit | Attending: Family Medicine | Admitting: Family Medicine

## 2022-08-19 ENCOUNTER — Ambulatory Visit: Payer: Federal, State, Local not specified - PPO | Admitting: Family Medicine

## 2022-08-19 VITALS — BP 110/74 | HR 74 | Temp 98.2°F | Ht 69.0 in | Wt 168.4 lb

## 2022-08-19 DIAGNOSIS — R634 Abnormal weight loss: Secondary | ICD-10-CM | POA: Diagnosis not present

## 2022-08-19 DIAGNOSIS — R195 Other fecal abnormalities: Secondary | ICD-10-CM | POA: Diagnosis not present

## 2022-08-19 DIAGNOSIS — Z125 Encounter for screening for malignant neoplasm of prostate: Secondary | ICD-10-CM

## 2022-08-19 NOTE — Progress Notes (Signed)
Phone [phone] In person visit   Subjective:   Brandon Clayton is a 47 y.o. year old very pleasant male patient who presents for/with See problem oriented charting Chief Complaint  Patient presents with   Follow-up    Pt is her to f/u from heart surgery has lost a lot of weight in last year and would like chest xray.   Past Medical History-  Patient Active Problem List   Diagnosis Date Noted   Pericardial cyst 11/18/2020    Priority: Medium    Hyperlipidemia 10/23/2020    Priority: Medium    Osteoarthritis of spine with radiculopathy, cervical region 12/04/2017    Priority: Medium    Paresthesias 11/28/2017    Priority: Medium    Acute left ankle pain 03/31/2018    Priority: Low   Lipoma of right upper extremity 07/18/2017    Priority: Low   Abscess 07/29/2016    Priority: Low   S/P robot-assisted surgical procedure 02/16/2022   Syncope 11/18/2020    Medications- reviewed and updated No current outpatient medications on file.   No current facility-administered medications for this visit.     Objective:  BP 110/74   Pulse 74   Temp 98.2 F (36.8 C)   Ht 5\' 9"  (1.753 m)   Wt 168 lb 6.4 oz (76.4 kg)   SpO2 99%   BMI 24.87 kg/m  Gen: NAD, resting comfortably No axillary, cervical, supraclavicular, groin lymphadenopathy noted CV: RRR no murmurs rubs or gallops Lungs: CTAB no crackles, wheeze, rhonchi Abdomen: soft/nontender/nondistended/normal bowel sounds. No rebound or guarding.  Ext: no edema Skin: warm, dry     Assessment and Plan   #social update- upcoming [place] trip [place] in [place]- wants to make sure in good shape before going- more of labor trip.   # Mediastinal fat peripheral arterial disease surgery follow up  S:patient with redo robotic assisted thoracoscopy with resection of mediastinal fat peripheral arterial disease on 02/16/22 thankfully negative for malignancy  Chest xray reassuring 04/01/22.   He states has an odd  sensation in his chest in last couple of weeks- like a slight squeeze but nothing like he had before surgery.   Has noted bread causes diarrhea A/P: Patient with some nonexertional squeezing chest discomfort intermittently-he would like to update chest x-ray I think is reasonable-certainly hopefully he does not need another surgery-also part of the unintentional weight loss workup as below  # Unintentional weight loss S:since November weight is down 21 lbs from August- weight was 205 in our office. Will eat excess of what he would normally eat and still end up losing weight. No night sweats or abnormal fatigue - some weight lifting but no high level exercise that he would anticipate causing this weight loss -has never lost this much weight Wt Readings from Last 3 Encounters:  08/19/22 168 lb 6.4 oz (76.4 kg)  04/01/22 189 lb (85.7 kg)  03/10/22 194 lb (88 kg)   Lab Results  Component Value Date   PSA 1.12 01/11/2022   PSA 1.01 01/06/2021   PSA 1.09 11/29/2019   A/P: Patient with unintentional weight loss.  Just had colonoscopy in 2023 with adenomatous polyp but we will still check stool cards.  Will check PSA though doubt prostate cancer development leading to weight loss within 6 months with reassuring PSA trend at last visit.  Chest x-ray would evaluate for obvious lung cancer.  Other blood work as below-also discussed considering CT of abdomen and pelvis if sedimentation  rate or ESR are elevated particularly if both elevated.  Has had some loose stools so check TSH -declines HIV, Hepatitis C Virus (HCV) testing   Recommended follow up: Return in about 1 month (around 09/18/2022) for followup or sooner if needed.Schedule b4 you leave. Future Appointments  Date Time Provider Department Center  10/14/2022  9:20 AM Shelva Majestic, MD [facility]  02/01/2023 10:00 AM Shelva Majestic, MD [facility]    Lab/Order associations:   ICD-10-CM   1. Unintentional weight loss  R63.4 DG Chest  2 View    Fecal occult blood, imunochemical    Urinalysis, Routine w reflex microscopic    Celiac Disease Comprehensive Panel with Reflexes    C-reactive protein    Sedimentation rate    TSH    Hemoglobin A1c    Comprehensive metabolic panel    CBC with Differential/Platelet    CBC with Differential/Platelet    Comprehensive metabolic panel    Hemoglobin A1c    TSH    Sedimentation rate    C-reactive protein    Urinalysis, Routine w reflex microscopic    Celiac Disease Comprehensive Panel with Reflexes    Fecal occult blood, imunochemical    Celiac Disease Comprehensive Panel with Reflexes    CANCELED: CBC with Differential/Platelet    CANCELED: Comprehensive metabolic panel    CANCELED: Hemoglobin A1c    CANCELED: TSH    CANCELED: Sedimentation rate    CANCELED: C-reactive protein    CANCELED: Celiac Disease Comprehensive Panel with Reflexes    CANCELED: Urinalysis, Routine w reflex microscopic    CANCELED: Celiac Disease Comprehensive Panel with Reflexes    2. Loose stools  R19.5 Celiac Disease Comprehensive Panel with Reflexes    Celiac Disease Comprehensive Panel with Reflexes    Celiac Disease Comprehensive Panel with Reflexes    CANCELED: Celiac Disease Comprehensive Panel with Reflexes    CANCELED: Celiac Disease Comprehensive Panel with Reflexes    3. Screening for prostate cancer  Z12.5 PSA    PSA    CANCELED: PSA      Time Spent: 25 minutes of total time (3:40 PM-4:05 PM) was spent on the date of the encounter performing the following actions: chart review prior to seeing the patient, obtaining history, performing a medically necessary exam, counseling on the treatment/workup plan, placing orders, and documenting in our EHR.    Return precautions advised.  Tana Conch, MD

## 2022-08-19 NOTE — Patient Instructions (Addendum)
At this point would love to see weight stabilization- labs today but even if no clear cause lets follow up again in 1-2 months may consider more advanced imaging like CT abdomen pelvis even if labs are normal  Please stop by lab before you go If you have mychart- we will send your results within 3 business days of [place] receiving them.  If you do not have mychart- we will call you about results within 5 business days of [place] receiving them.  *please also note that you will see labs on mychart as soon as they post. I will later go in and write notes on them- will say "notes from Dr. Durene Cal"   Please go to [place] [facility] (updated 07/11/2019) - located [address] across [address] from [place] - in the basement - Hours: 7:30-5:30 PM M-F. You do NOT need an appointment.    Recommended follow up: Return in about 1 month (around 09/18/2022) for followup or sooner if needed.Schedule b4 you leave.

## 2022-08-20 LAB — COMPREHENSIVE METABOLIC PANEL
AG Ratio: 1.7 (calc) (ref 1.0–2.5)
ALT: 27 U/L (ref 9–46)
AST: 21 U/L (ref 10–40)
Albumin: 4.5 g/dL (ref 3.6–5.1)
Alkaline phosphatase (APISO): 62 U/L (ref 36–130)
BUN: 14 mg/dL (ref 7–25)
CO2: 24 mmol/L (ref 20–32)
Calcium: 9.8 mg/dL (ref 8.6–10.3)
Chloride: 106 mmol/L (ref 98–110)
Creat: 1.01 mg/dL (ref 0.60–1.29)
Globulin: 2.6 g/dL (calc) (ref 1.9–3.7)
Glucose, Bld: 84 mg/dL (ref 65–99)
Potassium: 4.4 mmol/L (ref 3.5–5.3)
Sodium: 141 mmol/L (ref 135–146)
Total Bilirubin: 0.8 mg/dL (ref 0.2–1.2)
Total Protein: 7.1 g/dL (ref 6.1–8.1)

## 2022-08-20 LAB — CBC WITH DIFFERENTIAL/PLATELET
Absolute Monocytes: 639 cells/uL (ref 200–950)
Basophils Absolute: 54 cells/uL (ref 0–200)
Basophils Relative: 0.6 %
Eosinophils Absolute: 108 cells/uL (ref 15–500)
Eosinophils Relative: 1.2 %
HCT: 44.9 % (ref 38.5–50.0)
Hemoglobin: 15.6 g/dL (ref 13.2–17.1)
Lymphs Abs: 3042 cells/uL (ref 850–3900)
MCH: 31.6 pg (ref 27.0–33.0)
MCHC: 34.7 g/dL (ref 32.0–36.0)
MCV: 90.9 fL (ref 80.0–100.0)
MPV: 10 fL (ref 7.5–12.5)
Monocytes Relative: 7.1 %
Neutro Abs: 5157 cells/uL (ref 1500–7800)
Neutrophils Relative %: 57.3 %
Platelets: 360 10*3/uL (ref 140–400)
RBC: 4.94 10*6/uL (ref 4.20–5.80)
RDW: 12.6 % (ref 11.0–15.0)
Total Lymphocyte: 33.8 %
WBC: 9 10*3/uL (ref 3.8–10.8)

## 2022-08-20 LAB — PSA: PSA: 1.02 ng/mL (ref <=4.00)

## 2022-08-20 LAB — URINALYSIS, ROUTINE W REFLEX MICROSCOPIC
Bilirubin Urine: NEGATIVE
Glucose, UA: NEGATIVE
Hgb urine dipstick: NEGATIVE
Leukocytes,Ua: NEGATIVE
Nitrite: NEGATIVE
Protein, ur: NEGATIVE
Specific Gravity, Urine: 1.015 (ref 1.001–1.035)
pH: 6 (ref 5.0–8.0)

## 2022-08-20 LAB — TSH: TSH: 1.62 mIU/L (ref 0.40–4.50)

## 2022-08-20 LAB — HEMOGLOBIN A1C
Hgb A1c MFr Bld: 5 % of total Hgb (ref <5.7)
Mean Plasma Glucose: 97 mg/dL
eAG (mmol/L): 5.4 mmol/L

## 2022-08-20 LAB — C-REACTIVE PROTEIN: CRP: 0.5 mg/L (ref <8.0)

## 2022-08-20 LAB — SEDIMENTATION RATE: Sed Rate: 2 mm/h (ref 0–15)

## 2022-08-22 LAB — CELIAC DISEASE COMPREHENSIVE PANEL WITH REFLEXES
(tTG) Ab, IgA: <1 U/mL
Immunoglobulin A: 311 mg/dL — ABNORMAL HIGH (ref 47–310)

## 2022-08-25 ENCOUNTER — Other Ambulatory Visit (INDEPENDENT_AMBULATORY_CARE_PROVIDER_SITE_OTHER): Payer: Federal, State, Local not specified - PPO

## 2022-08-25 DIAGNOSIS — R634 Abnormal weight loss: Secondary | ICD-10-CM

## 2022-08-25 LAB — FECAL OCCULT BLOOD, IMMUNOCHEMICAL: Fecal Occult Bld: NEGATIVE

## 2022-08-30 ENCOUNTER — Ambulatory Visit: Payer: Federal, State, Local not specified - PPO | Admitting: Family Medicine

## 2022-10-14 ENCOUNTER — Ambulatory Visit: Payer: Federal, State, Local not specified - PPO | Admitting: Family Medicine

## 2022-11-25 ENCOUNTER — Ambulatory Visit: Payer: Federal, State, Local not specified - PPO | Admitting: Family

## 2022-11-25 VITALS — BP 110/73 | HR 68 | Temp 98.0°F | Wt 167.4 lb

## 2022-11-25 DIAGNOSIS — M25511 Pain in right shoulder: Secondary | ICD-10-CM | POA: Diagnosis not present

## 2022-11-25 MED ORDER — PREDNISONE 10 MG PO TABS: ORAL_TABLET | ORAL | 0 refills | Status: DC
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
## 2022-12-05 IMAGING — MR MR CARD MORPHOLOGY WO/W CM
45 of 48 series · 45 of 48 positions shown · IV contrast (gadavist)
Comparison: none

CLINICAL DATA: Possible pericardial cyst

EXAM:
CARDIAC MRI
TECHNIQUE: The patient was scanned on a 1.5 Tesla Siemens magnet. A dedicated
cardiac coil was used. Functional imaging was done using Fiesta
sequences. [DATE], and 4 chamber views were done to assess for RWMA's.
Modified Kassem rule using a short axis stack was used to
calculate an ejection fraction on a dedicated work station using
Circle software. The patient received 8 cc of Gadavist. After 10
minutes inversion recovery sequences were used to assess for
infiltration and scar tissue.
CONTRAST:  8 cc  of Gadavist

[Series 4: t2_haste_db_tra_bh · axial · 8.0mm · 1.41mm/px · 1 of 18 slices shown]
[im 1/18]
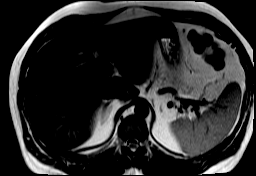

[Series 8: t2_trufi_single-shot-free breathing · axial · 4.5mm · 1.52mm/px · 1 of 30 slices shown (1 of 2)]
[im 1/30]
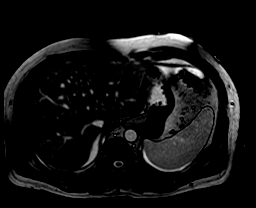

[Series 9: t2_haste_cor_p3_320_bh_insp · axial · 4.5mm · 1.25mm/px · 1 of 30 slices shown]
[im 1/30]
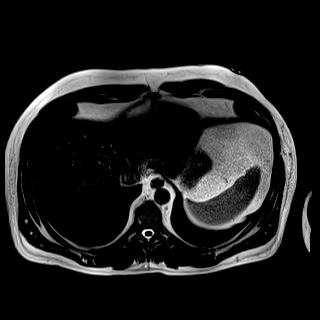

[Series 10: bSSFP · oblique · 8.0mm · 1.61mm/px · 1 of 25 slices shown (1 of 20)]
[im 1/25]
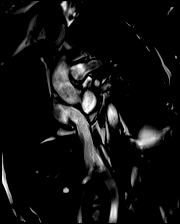

[Series 11: bSSFP · oblique · 8.0mm · 1.61mm/px · 1 of 25 slices shown (2 of 20)]
[im 1/25]
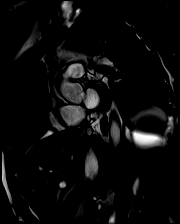

[Series 12: bSSFP · oblique · 8.0mm · 1.61mm/px · 1 of 25 slices shown (3 of 20)]
[im 1/25]
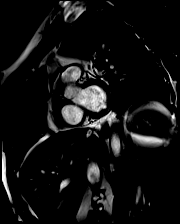

[Series 13: bSSFP · oblique · 8.0mm · 1.61mm/px · 1 of 25 slices shown (4 of 20)]
[im 1/25]
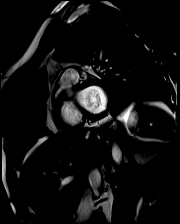

[Series 14: bSSFP · oblique · 8.0mm · 1.61mm/px · 1 of 25 slices shown (5 of 20)]
[im 1/25]
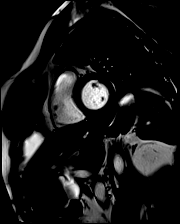

[Series 15: bSSFP · oblique · 8.0mm · 1.61mm/px · 1 of 25 slices shown (6 of 20)]
[im 1/25]
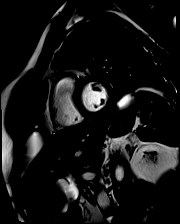

[Series 16: bSSFP · oblique · 8.0mm · 1.61mm/px · 1 of 25 slices shown (7 of 20)]
[im 1/25]
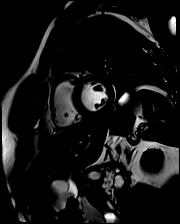

[Series 17: bSSFP · oblique · 8.0mm · 1.61mm/px · 1 of 25 slices shown (8 of 20)]
[im 1/25]
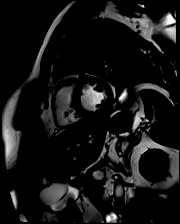

[Series 18: bSSFP · oblique · 8.0mm · 1.61mm/px · 1 of 25 slices shown (9 of 20)]
[im 1/25]
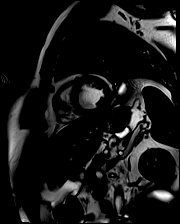

[Series 19: bSSFP · oblique · 8.0mm · 1.61mm/px · 1 of 25 slices shown (10 of 20)]
[im 1/25]
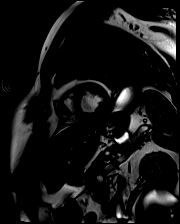

[Series 20: bSSFP · oblique · 8.0mm · 1.61mm/px · 1 of 25 slices shown (11 of 20)]
[im 1/25]
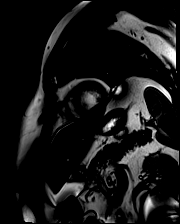

[Series 21: bSSFP · oblique · 8.0mm · 1.61mm/px · 1 of 25 slices shown (12 of 20)]
[im 1/25]
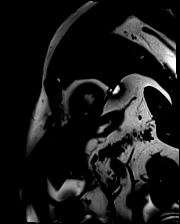

[Series 22: bSSFP · oblique · 8.0mm · 1.61mm/px · 1 of 25 slices shown (13 of 20)]
[im 1/25]
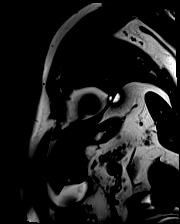

[Series 23: bSSFP · oblique · 8.0mm · 1.61mm/px · 1 of 25 slices shown (14 of 20)]
[im 1/25]
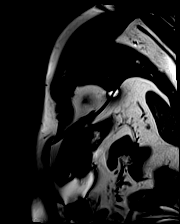

[Series 24: bSSFP · oblique · 8.0mm · 1.61mm/px · 1 of 25 slices shown (15 of 20)]
[im 1/25]
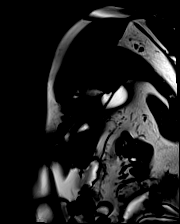

[Series 25: bSSFP · oblique · 8.0mm · 1.61mm/px · 1 of 25 slices shown (16 of 20)]
[im 1/25]
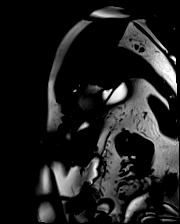

[Series 26: t1_vibe_fs_tra_p4_bh_pre · axial · 2.0mm · 1.19mm/px · 1 of 80 slices shown]
[im 1/80]
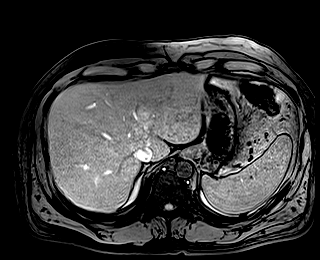

[Series 27: t2_trufi_single-shot-free breathing · sagittal · 4.5mm · 1.52mm/px · 1 of 12 slices shown (2 of 2)]
[im 1/12]
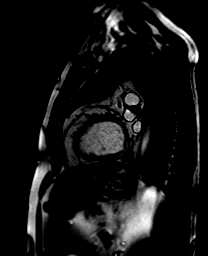

[Series 28: bSSFP · oblique · 6.0mm · 1.41mm/px · 1 of 25 slices shown (17 of 20)]
[im 1/25]
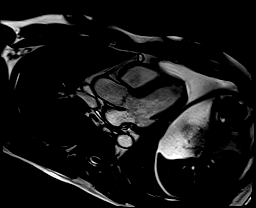

[Series 29: bSSFP · sagittal · 6.0mm · 1.41mm/px · 1 of 25 slices shown (18 of 20)]
[im 1/25]
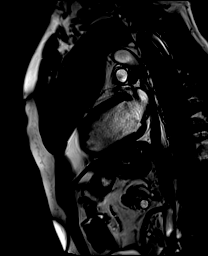

[Series 30: bSSFP · axial · 6.0mm · 1.41mm/px · 1 of 25 slices shown (19 of 20)]
[im 1/25]
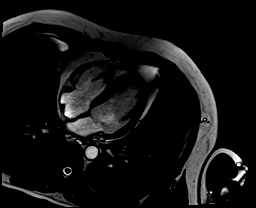

[Series 31: (id)_long_t1 · coronal · 8.0mm · 1.56mm/px · 1 of 24 slices shown]
[im 1/24]
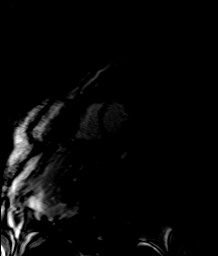

[Series 32: (id)_long_t1_moco · coronal · 8.0mm · 1.56mm/px · 1 of 24 slices shown]
[im 1/24]
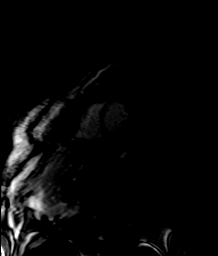

[Series 33: (id)_long_t1_moco_t1 · coronal · 8.0mm · 1.56mm/px · 1 of 6 slices shown]
[im 1/6]
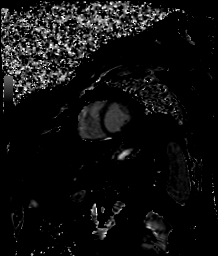

[Series 35: (id)_trufi · coronal · 8.0mm · 2.08mm/px · 1 of 9 slices shown]
[im 1/9]
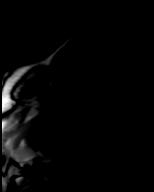

[Series 36: (id)_trufi_moco · coronal · 8.0mm · 2.08mm/px · 1 of 9 slices shown]
[im 1/9]
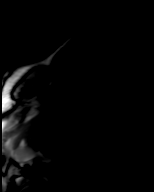

[Series 37: (id)_trufi_moco_t2 · coronal · 8.0mm · 2.08mm/px · 1 of 3 slices shown]
[im 1/3]
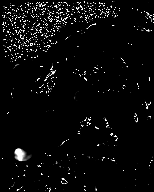

[Series 39: bSSFP · sagittal · 6.0mm · 1.41mm/px · 1 of 25 slices shown (20 of 20)]
[im 1/25]
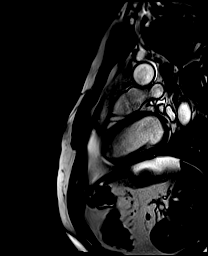

[Series 40: pre short axis · coronal · non-contrast · 8.0mm · 2.25mm/px · 1 of 10 slices shown (1 of 6)]
[im 1/10]
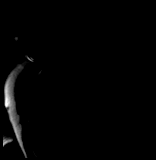

[Series 41: pre short axis · coronal · non-contrast · 8.0mm · 2.25mm/px · 1 of 10 slices shown (2 of 6)]
[im 1/10]
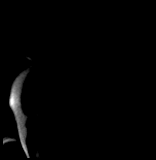

[Series 42: pre short axis · coronal · non-contrast · 8.0mm · 2.25mm/px · 1 of 10 slices shown (3 of 6)]
[im 1/10]
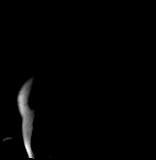

[Series 43: pre short axis · coronal · non-contrast · 8.0mm · 2.25mm/px · 1 of 10 slices shown (4 of 6)]
[im 1/10]
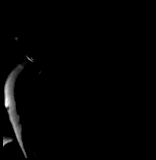

[Series 44: pre short axis · coronal · non-contrast · 8.0mm · 2.25mm/px · 1 of 10 slices shown (5 of 6)]
[im 1/10]
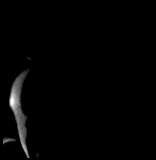

[Series 45: pre short axis · coronal · non-contrast · 8.0mm · 2.25mm/px · 1 of 10 slices shown (6 of 6)]
[im 1/10]
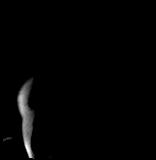

[Series 47: t2_blade_fs_tra_p2_mbh_insp · axial · 4.5mm · 1.56mm/px · 1 of 30 slices shown (1 of 2)]
[im 1/30]
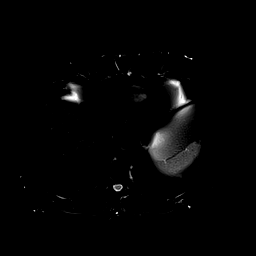

[Series 48: t2_blade_fs_tra_p2_mbh_insp · sagittal · 4.5mm · 1.56mm/px · 1 of 26 slices shown (2 of 2)]
[im 1/26]
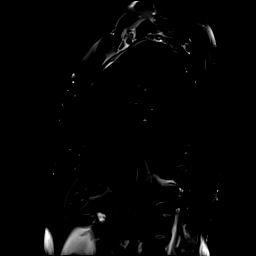

[Series 49: t2_haste_fs_tra_p2_320_mbh_insp_320 · axial · 4.5mm · 1.25mm/px · 1 of 30 slices shown]
[im 1/30]
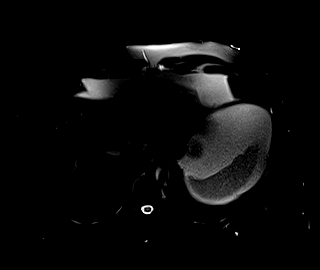

[Series 50: t1_tse_db short axis · axial · 8.0mm · 1.32mm/px · 1 of 1 slices shown]
[im 1/1]
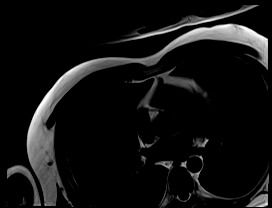

[Series 51: t1_tse_fs db short · axial · 8.0mm · 1.32mm/px · 1 of 1 slices shown]
[im 1/1]
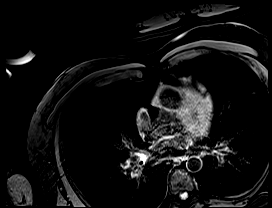

[Series 52: rest short axis · coronal · 8.0mm · 2.25mm/px · 1 of 60 slices shown (1 of 3)]
[im 1/60]
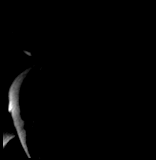

[Series 53: rest short axis · coronal · 8.0mm · 2.25mm/px · 1 of 60 slices shown (2 of 3)]
[im 1/60]
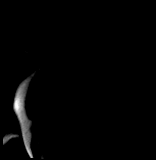

[Series 54: rest short axis · coronal · 8.0mm · 2.25mm/px · 1 of 60 slices shown (3 of 3)]
[im 1/60]
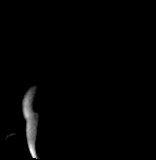

[45 of 48 positions shown; findings below may reference images not displayed]

FINDINGS: Left ventricle:

-Normal size

-Normal systolic function

-Normal ECV (22%)

-No LGE

LV EF: 57% (Normal 56-78%)

Absolute volumes:

LV EDV: 115mL (Normal 77-195 mL)

LV ESV: 50mL (Normal 19-72 mL)

LV SV: 65mL (Normal 51-133 mL)

CO: 4.2L/min (Normal 2.8-8.8 L/min)

Indexed volumes:

LV EDV: 54mL/sq-m (Normal 47-92 mL/sq-m)

LV ESV: 23mL/sq-m (Normal 13-30 mL/sq-m)

LV SV: 30mL/sq-m (Normal 32-62 mL/sq-m)

CI: 2.0L/min/sq-m (Normal 1.7-4.2 L/min/sq-m)

Right ventricle: Normal size and systolic function

RV EF:  51% (Normal 47-74%)

Absolute volumes:

RV EDV: 133mL (Normal 88-227 mL)

RV ESV: 66mL (Normal 23-103 mL)

RV SV: 68mL (Normal 52-138 mL)

CO: 4.4L/min (Normal 2.8-8.8 L/min)

Indexed volumes:

RV EDV: 62mL/sq-m (Normal 55-105 mL/sq-m)

RV ESV: 31mL/sq-m (Normal 15-43 mL/sq-m)

RV SV: 32mL/sq-m (Normal 32-64 mL/sq-m)

CI: 2.1L/min/sq-m (Normal 1.7-4.2 L/min/sq-m)

Left atrium: Normal size

Right atrium: Normal size

Mitral valve: No regurgitation

Aortic valve: No regurgitation

Tricuspid valve: No regurgitation

Pulmonic valve: No regurgitation

Aorta: Normal proximal ascending aorta

Pericardium: Small mass anterior to RV free wall measuring 10mm x
7mm. Mass is hyperintense on T2 weighted imaging with no late
gadolinium enhancement, consistent with cyst.
IMPRESSION: 1. Small mass anterior to RV free wall measuring 10mm x 7mm. Mass is
hyperintense on T2 weighted imaging with no late gadolinium
enhancement, consistent with a small cyst.

2. Normal LV size and systolic function (EF 57%). No LGE to suggest
myocardial scar

3.  Normal RV size and systolic function (EF 51%)

## 2022-12-07 ENCOUNTER — Encounter: Payer: Self-pay | Admitting: Surgery

## 2022-12-07 ENCOUNTER — Telehealth: Payer: Self-pay | Admitting: Family Medicine

## 2022-12-07 ENCOUNTER — Ambulatory Visit: Payer: Federal, State, Local not specified - PPO | Admitting: Surgery

## 2022-12-07 VITALS — BP 125/88 | HR 70 | Temp 98.0°F | Ht 69.0 in | Wt 164.0 lb

## 2022-12-07 DIAGNOSIS — M25511 Pain in right shoulder: Secondary | ICD-10-CM

## 2022-12-07 DIAGNOSIS — M4722 Other spondylosis with radiculopathy, cervical region: Secondary | ICD-10-CM | POA: Diagnosis not present

## 2022-12-07 NOTE — Telephone Encounter (Signed)
See below

## 2022-12-07 NOTE — Telephone Encounter (Signed)
Patient states saw Hudnell on 7/12 for shoulder pain. States that prednisone isn't helping with pain and he wanted to let [facility] know as he was advised to do. States she mentioned orthocare evaluation. Please Advise.

## 2022-12-07 NOTE — Patient Instructions (Addendum)
We will get you scheduled for a CT scan of the spine. We will call you about scheduling this.  We will send a referral to Physiotherapy. They will call you to schedule this appointment.   We will call you about your results.

## 2022-12-08 ENCOUNTER — Other Ambulatory Visit: Payer: Self-pay

## 2022-12-08 ENCOUNTER — Telehealth: Payer: Self-pay

## 2022-12-08 DIAGNOSIS — M5414 Radiculopathy, thoracic region: Secondary | ICD-10-CM

## 2022-12-08 DIAGNOSIS — M5412 Radiculopathy, cervical region: Secondary | ICD-10-CM

## 2022-12-08 DIAGNOSIS — M4722 Other spondylosis with radiculopathy, cervical region: Secondary | ICD-10-CM

## 2022-12-08 LAB — LIPID(CHOL FRACT) PANEL, BLOOD
Cholesterol: 131 mg/dL (ref <200)
Cholesterol: BORDERLINE mg/dL (ref <200)
Cholesterol: HIGH mg/dL (ref <200)
HDL Cholesterol: 49 mg/dL (ref >40)
LDL Cholesterol (calc): 72 mg/dL (ref <160)
LDL Cholesterol (calc): BORDERLINE mg/dL (ref <160)
LDL Cholesterol (calc): HIGH mg/dL (ref <160)
LDL Cholesterol (calc): HIGH mg/dL (ref <160)
Non HDL Cholesterol (calculated): 82 mg/dL (ref <130)
Non HDL Cholesterol (calculated): BORDERLINE mg/dL (ref <130)
Non HDL Cholesterol (calculated): HIGH mg/dL (ref <130)
Non HDL Cholesterol (calculated): HIGH mg/dL (ref <130)
Triglycerides: 48 mg/dL (ref <150)
Triglycerides: BORDERLINE mg/dL (ref <150)
Triglycerides: HIGH mg/dL (ref <150)
Triglycerides: HIGH mg/dL (ref <150)
Triglycerides: NORMAL mg/dL (ref <150)
VLDL Cholesterol (calculated): 10 mg/dL

## 2022-12-08 LAB — BASIC METABOLIC PANEL, BLOOD
BUN: 31 mg/dL — ABNORMAL HIGH (ref 7–25)
CO2: 32 mmol/L — ABNORMAL HIGH (ref 21–31)
Calcium: 8.2 mg/dL — ABNORMAL LOW (ref 8.6–10.3)
Chloride: 97 mmol/L — ABNORMAL LOW (ref 98–107)
Creat: 0.6 mg/dL — ABNORMAL LOW (ref 0.7–1.3)
Electrolyte Balance: 6 mmol/L (ref 2–12)
Glucose: 110 mg/dL (ref 85–125)
Glucose: 5 mg/dL (ref 85–125)
Glucose: NORMAL mg/dL (ref 85–125)
Potassium: 3.9 mmol/L (ref 3.5–5.1)
Sodium: 135 mmol/L — ABNORMAL LOW (ref 136–145)
eGFR - high estimate: >60 meq/L (ref >59)
eGFR - low estimate: >60 (ref >59)

## 2022-12-08 LAB — HEMOGRAM, BLOOD
Hematocrit: 22.4 % — ABNORMAL LOW (ref 39.5–50.0)
Hgb: 7.7 g/dL — ABNORMAL LOW (ref 13.5–16.9)
MCH: 35 pg — ABNORMAL HIGH (ref 27.0–33.5)
MCHC: 34.3 g/dL (ref 32.0–35.5)
MCV: 102 FL — ABNORMAL HIGH (ref 81.5–97.0)
MPV: 7.8 FL (ref 7.2–11.7)
PLT Count: 390 10*3/uL (ref 150–400)
RBC: 2.2 10*6/uL — ABNORMAL LOW (ref 4.38–5.62)
RDW-CV: 16.4 % — ABNORMAL HIGH (ref 11.6–14.4)
White Bld Cell Count: 3.2 10*3/uL — ABNORMAL LOW (ref 4.0–10.5)

## 2022-12-08 LAB — GLYCOSYLATED HGB(A1C), BLOOD: Glycated Hgb, A1C: 5 % (ref 4.6–5.6)
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
## 2022-12-08 NOTE — Telephone Encounter (Signed)
Spoke with the patient regarding his CT scan. The patient is scheduled for a CT scan of the C-Spine and T-Spine with out contrast at [facility] on 12/21/22. He will need to arrive there by 7:30 am and go in through entrance A.

## 2022-12-09 ENCOUNTER — Other Ambulatory Visit: Payer: Self-pay

## 2022-12-09 DIAGNOSIS — M5414 Radiculopathy, thoracic region: Secondary | ICD-10-CM

## 2022-12-09 DIAGNOSIS — M5412 Radiculopathy, cervical region: Secondary | ICD-10-CM

## 2022-12-13 ENCOUNTER — Other Ambulatory Visit: Payer: Self-pay | Admitting: Physical Medicine & Rehabilitation

## 2022-12-13 DIAGNOSIS — M542 Cervicalgia: Secondary | ICD-10-CM

## 2022-12-13 NOTE — Telephone Encounter (Signed)
please send referral to sports med in  for acute right shoulder pain DX & let pt know, thx.

## 2022-12-15 LAB — HEMOGRAM, BLOOD
Hematocrit: 24.2 % — ABNORMAL LOW (ref 39.5–50.0)
Hgb: 8.2 g/dL — ABNORMAL LOW (ref 13.5–16.9)
MCH: 35.2 pg — ABNORMAL HIGH (ref 27.0–33.5)
MCHC: 34 g/dL (ref 32.0–35.5)
MPV: 7.4 FL (ref 7.2–11.7)
PLT Count: 592 10*3/uL — ABNORMAL HIGH (ref 150–400)
RBC: 2.34 10*6/uL — ABNORMAL LOW (ref 4.38–5.62)
RDW-CV: 17.7 % — ABNORMAL HIGH (ref 11.6–14.4)
White Bld Cell Count: 4.8 10*3/uL (ref 4.0–10.5)

## 2022-12-15 LAB — BASIC METABOLIC PANEL, BLOOD
BUN: 28 mg/dL — ABNORMAL HIGH (ref 7–25)
CO2: 25 mmol/L (ref 21–31)
Calcium: 8.2 mg/dL — ABNORMAL LOW (ref 8.6–10.3)
Chloride: 104 mmol/L (ref 98–107)
Creat: 0.8 mg/dL (ref 0.7–1.3)
Electrolyte Balance: 9 mmol/L (ref 2–12)
Glucose: 79 mg/dL — ABNORMAL LOW (ref 85–125)
Glucose: NORMAL mg/dL — ABNORMAL LOW (ref 85–125)
Potassium: 3.9 mmol/L (ref 3.5–5.1)
Sodium: 138 mmol/L (ref 136–145)
eGFR - high estimate: >60 U/L (ref >59)
eGFR - low estimate: >60 % (ref >59)

## 2022-12-21 ENCOUNTER — Ambulatory Visit
Admission: RE | Admit: 2022-12-21 | Discharge: 2022-12-21 | Disposition: A | Discharge status: Home / Self Care | Payer: Federal, State, Local not specified - PPO | Source: Ambulatory Visit | Attending: Physical Medicine & Rehabilitation | Admitting: Physical Medicine & Rehabilitation

## 2022-12-21 ENCOUNTER — Ambulatory Visit (HOSPITAL_COMMUNITY): Payer: Federal, State, Local not specified - PPO

## 2022-12-21 DIAGNOSIS — M542 Cervicalgia: Secondary | ICD-10-CM | POA: Diagnosis not present

## 2022-12-29 ENCOUNTER — Encounter (INDEPENDENT_AMBULATORY_CARE_PROVIDER_SITE_OTHER): Payer: Self-pay

## 2022-12-29 DIAGNOSIS — M542 Cervicalgia: Secondary | ICD-10-CM | POA: Diagnosis not present

## 2022-12-29 DIAGNOSIS — M5412 Radiculopathy, cervical region: Secondary | ICD-10-CM | POA: Diagnosis not present

## 2023-01-02 DIAGNOSIS — M5412 Radiculopathy, cervical region: Secondary | ICD-10-CM | POA: Diagnosis not present

## 2023-01-16 ENCOUNTER — Encounter: Payer: Federal, State, Local not specified - PPO | Admitting: Family Medicine
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
## 2023-01-24 ENCOUNTER — Ambulatory Visit
Admission: RE | Admit: 2023-01-24 | Discharge: 2023-01-24 | Disposition: A | Discharge status: Home / Self Care | Payer: Federal, State, Local not specified - PPO | Attending: Neurosurgery | Admitting: Neurosurgery

## 2023-01-24 ENCOUNTER — Ambulatory Visit: Payer: Federal, State, Local not specified - PPO | Admitting: Neurosurgery

## 2023-01-24 ENCOUNTER — Encounter: Payer: Self-pay | Admitting: Neurosurgery

## 2023-01-24 ENCOUNTER — Ambulatory Visit
Admission: RE | Admit: 2023-01-24 | Discharge: 2023-01-24 | Disposition: A | Discharge status: Home / Self Care | Payer: Federal, State, Local not specified - PPO | Source: Ambulatory Visit | Attending: Neurosurgery | Admitting: Neurosurgery

## 2023-01-24 VITALS — BP 116/72 | Ht 69.0 in | Wt 161.0 lb

## 2023-01-24 DIAGNOSIS — M4722 Other spondylosis with radiculopathy, cervical region: Secondary | ICD-10-CM | POA: Diagnosis not present

## 2023-01-24 DIAGNOSIS — M5412 Radiculopathy, cervical region: Secondary | ICD-10-CM | POA: Insufficient documentation

## 2023-01-24 DIAGNOSIS — M4802 Spinal stenosis, cervical region: Secondary | ICD-10-CM

## 2023-01-24 DIAGNOSIS — M5010 Cervical disc disorder with radiculopathy, unspecified cervical region: Secondary | ICD-10-CM | POA: Diagnosis not present

## 2023-01-25 DIAGNOSIS — M542 Cervicalgia: Secondary | ICD-10-CM | POA: Diagnosis not present

## 2023-01-25 DIAGNOSIS — M546 Pain in thoracic spine: Secondary | ICD-10-CM | POA: Diagnosis not present

## 2023-01-26 ENCOUNTER — Telehealth: Payer: Self-pay | Admitting: Neurosurgery

## 2023-01-26 ENCOUNTER — Other Ambulatory Visit: Payer: Self-pay

## 2023-01-26 ENCOUNTER — Ambulatory Visit (INDEPENDENT_AMBULATORY_CARE_PROVIDER_SITE_OTHER): Payer: Federal, State, Local not specified - PPO | Admitting: Neurosurgery

## 2023-01-26 DIAGNOSIS — M5412 Radiculopathy, cervical region: Secondary | ICD-10-CM

## 2023-01-26 DIAGNOSIS — Z01818 Encounter for other preprocedural examination: Secondary | ICD-10-CM

## 2023-01-26 DIAGNOSIS — M4802 Spinal stenosis, cervical region: Secondary | ICD-10-CM

## 2023-01-26 NOTE — Addendum Note (Signed)
Addended by: Sharlot Gowda on: 01/26/2023 06:40 PM   Modules accepted: Orders

## 2023-01-26 NOTE — Progress Notes (Deleted)
Planned surgery: C5-7 arthroplasty   Surgery date: 02/13/23 at [facility]: [address], [place], [place] [address]) - you will find out your arrival time the business day before your surgery.   Pre-op appointment at [facility] Pre-admit Testing: we will call you with a date/time for this. If you are scheduled for an in person appointment, Pre-admit Testing is located on the first floor of the [facility] building, [address], [address]. Please bring all prescriptions in the original prescription bottles to your appointment. During this appointment, they will advise you which medications you can take the morning of surgery, and which medications you will need to hold for surgery. Labs (such as blood work, EKG) may be done at your pre-op appointment. You are not required to fast for these labs. Should you need to change your pre-op appointment, please call Pre-admit testing at [phone].     Surgical clearance: we will send a clearance form to Dr Durene Cal    Common restrictions after surgery: No bending, lifting, or twisting ("BLT"). Avoid lifting objects heavier than 10 pounds for the first 6 weeks after surgery. Where possible, avoid household activities that involve lifting, bending, reaching, pushing, or pulling such as laundry, vacuuming, grocery shopping, and childcare. Try to arrange for help from friends and family for these activities while you heal. Do not drive while taking prescription pain medication. Weeks 6 through 12 after surgery: avoid lifting more than 25 pounds.    X-rays after surgery: Because you are having an arthroplasty: for appointments after your 2 week follow-up: please arrive at the [facility] outpatient imaging center ([address], [address], [place]) or [facility] one hour prior to your appointment for x-rays. This applies to every appointment after your 2 week follow-up.  Failure to do so may result in your appointment being rescheduled.   How to contact us:  If you have any questions/concerns before or after surgery, you can reach [place] at [phone], or you can send a mychart message. We can be reached by phone or mychart 8am-4pm, Monday-Friday.  *Please note: Calls after 4pm are forwarded to a third party answering service. Mychart messages are not routinely monitored during evenings, weekends, and holidays. Please call our office to contact the answering service for urgent concerns during non-business hours.    If you have FMLA/disability paperwork, please drop it off or fax it to [phone], attention Patty.   Appointments/FMLA & disability paperwork: Patty Nurse: Royston Cowper  Medical assistants: Laurann Montana, & Lyla Son Physician Assistants: Manning Charity & Drake Leach Surgeons: Venetia Night, MD & Ernestine Mcmurray, MD

## 2023-01-26 NOTE — Telephone Encounter (Signed)
I notified him that Dr Myer Haff will call him at the end of the clinic day. He said closer to 4:30 would be better for him because he is off work at [employer]

## 2023-01-26 NOTE — Telephone Encounter (Signed)
[facility] note has been scanned.

## 2023-01-26 NOTE — Telephone Encounter (Signed)
Note has been requested.

## 2023-01-26 NOTE — Patient Instructions (Signed)
Planned surgery: C5-7 arthroplasty   Surgery date: 02/13/23 at [facility]: [address], [place], [place] [address]) - you will find out your arrival time the business day before your surgery.   Pre-op appointment at [facility] Pre-admit Testing: we will call you with a date/time for this. If you are scheduled for an in person appointment, Pre-admit Testing is located on the first floor of the [facility] building, [address], [address]. Please bring all prescriptions in the original prescription bottles to your appointment. During this appointment, they will advise you which medications you can take the morning of surgery, and which medications you will need to hold for surgery. Labs (such as blood work, EKG) may be done at your pre-op appointment. You are not required to fast for these labs. Should you need to change your pre-op appointment, please call Pre-admit testing at [phone].     Surgical clearance: we will send a clearance form to Dr Durene Cal    Common restrictions after surgery: No bending, lifting, or twisting ("BLT"). Avoid lifting objects heavier than 10 pounds for the first 6 weeks after surgery. Where possible, avoid household activities that involve lifting, bending, reaching, pushing, or pulling such as laundry, vacuuming, grocery shopping, and childcare. Try to arrange for help from friends and family for these activities while you heal. Do not drive while taking prescription pain medication. Weeks 6 through 12 after surgery: avoid lifting more than 25 pounds.    X-rays after surgery: Because you are having an arthroplasty: for appointments after your 2 week follow-up: please arrive at the [facility] outpatient imaging center ([address], [address], [place]) or [facility] one hour prior to your appointment for x-rays. This applies to every appointment after your 2 week follow-up.  Failure to do so may result in your appointment being rescheduled.   How to contact us:  If you have any questions/concerns before or after surgery, you can reach [place] at [phone], or you can send a mychart message. We can be reached by phone or mychart 8am-4pm, Monday-Friday.  *Please note: Calls after 4pm are forwarded to a third party answering service. Mychart messages are not routinely monitored during evenings, weekends, and holidays. Please call our office to contact the answering service for urgent concerns during non-business hours.    If you have FMLA/disability paperwork, please drop it off or fax it to [phone], attention Patty.   Appointments/FMLA & disability paperwork: Patty Nurse: Royston Cowper  Medical assistants: Laurann Montana, & Lyla Son Physician Assistants: Manning Charity & Drake Leach Surgeons: Venetia Night, MD & Ernestine Mcmurray, MD

## 2023-01-26 NOTE — Telephone Encounter (Addendum)
Dr Jeannie Fend, does he need to come back in to discuss surgery and the risks, etc?  Patty, can we get a copy of the discharge note?

## 2023-01-26 NOTE — Telephone Encounter (Signed)
Patient was seen at [facility] yesterday at 4pm and they discharged him. Can he get a surgery date?

## 2023-01-26 NOTE — Progress Notes (Signed)
[name] was evaluated by physical therapy, and not felt to be a good candidate for therapy.  As such, he no longer has options for conservative management.  Given his severe cervical radiculopathy affecting the C6 and C7 nerve roots on the right, I have recommended a C5-7 cervical disc arthroplasty.  I evaluated his x-rays and he has a good candidate for this operation.  I discussed the planned procedure at length with the patient, including the risks, benefits, alternatives, and indications. The risks discussed include but are not limited to bleeding, infection, need for reoperation, spinal fluid leak, stroke, vision loss, anesthetic complication, coma, paralysis, and even death. We also discussed the possibility of post-operative dysphagia, vocal cord paralysis, and the risk of adjacent segment disease in the future. I also described in detail that improvement was not guaranteed.  The patient expressed understanding of these risks, and asked that we proceed with surgery. I described the surgery in layman's terms, and gave ample opportunity for questions, which were answered to the best of my ability.

## 2023-01-30 ENCOUNTER — Telehealth: Payer: Self-pay

## 2023-01-30 LAB — MISCELLANEOUS TEST (CHEMISTRY)
Misc (Chem) Reference Lab: 13.2 % (ref 12.4–15.4)
Misc (Chem) Reference Lab: 1600 /HPF (ref 0–5)
Misc (Chem) Reference Lab: 1600 /HPF (ref 0–5)
Misc (Chem) Reference Lab: 1600 mmol/L (ref 7–16)
Misc (Chem) Reference Lab: NONREACTIVE

## 2023-01-30 LAB — MISC LP TEST, REFERRAL ONLY

## 2023-01-30 NOTE — Telephone Encounter (Signed)
Please call and schedule pt for ov for surgery clearance either tomorrow or Thursday at 4:20pm. If able, please have pt arrive at 4pm.

## 2023-02-01 ENCOUNTER — Ambulatory Visit (HOSPITAL_COMMUNITY)
Admission: RE | Admit: 2023-02-01 | Discharge: 2023-02-01 | Disposition: A | Discharge status: Home / Self Care | Payer: Federal, State, Local not specified - PPO | Source: Ambulatory Visit | Attending: Family Medicine | Admitting: Family Medicine

## 2023-02-01 ENCOUNTER — Encounter
Admission: RE | Admit: 2023-02-01 | Discharge: 2023-02-01 | Disposition: A | Discharge status: Home / Self Care | Payer: Federal, State, Local not specified - PPO | Source: Ambulatory Visit | Attending: Neurosurgery | Admitting: Neurosurgery

## 2023-02-01 ENCOUNTER — Other Ambulatory Visit: Payer: Self-pay

## 2023-02-01 ENCOUNTER — Ambulatory Visit (INDEPENDENT_AMBULATORY_CARE_PROVIDER_SITE_OTHER): Payer: Federal, State, Local not specified - PPO | Admitting: Family Medicine

## 2023-02-01 ENCOUNTER — Encounter: Payer: Self-pay | Admitting: Family Medicine

## 2023-02-01 VITALS — BP 114/74 | HR 85 | Ht 69.0 in | Wt 161.0 lb

## 2023-02-01 VITALS — BP 111/72 | HR 67 | Temp 98.1°F | Resp 16 | Ht 69.0 in | Wt 162.9 lb

## 2023-02-01 DIAGNOSIS — Z0000 Encounter for general adult medical examination without abnormal findings: Secondary | ICD-10-CM | POA: Diagnosis not present

## 2023-02-01 DIAGNOSIS — Z125 Encounter for screening for malignant neoplasm of prostate: Secondary | ICD-10-CM | POA: Diagnosis not present

## 2023-02-01 DIAGNOSIS — R634 Abnormal weight loss: Secondary | ICD-10-CM | POA: Insufficient documentation

## 2023-02-01 DIAGNOSIS — Z01812 Encounter for preprocedural laboratory examination: Secondary | ICD-10-CM | POA: Insufficient documentation

## 2023-02-01 DIAGNOSIS — K429 Umbilical hernia without obstruction or gangrene: Secondary | ICD-10-CM | POA: Diagnosis not present

## 2023-02-01 DIAGNOSIS — E782 Mixed hyperlipidemia: Secondary | ICD-10-CM

## 2023-02-01 DIAGNOSIS — Z01818 Encounter for other preprocedural examination: Secondary | ICD-10-CM | POA: Diagnosis not present

## 2023-02-01 HISTORY — DX: Benign lipomatous neoplasm of skin and subcutaneous tissue of right arm: D17.21

## 2023-02-01 HISTORY — DX: Hyperlipidemia, unspecified: E78.5

## 2023-02-01 HISTORY — DX: Angina pectoris, unspecified: I20.9

## 2023-02-01 LAB — COMPREHENSIVE METABOLIC PANEL WITH GFR
ALT: 27 U/L (ref 0–53)
AST: 19 U/L (ref 0–37)
Albumin: 4.4 g/dL (ref 3.5–5.2)
Alkaline Phosphatase: 68 U/L (ref 39–117)
BUN: 18 mg/dL (ref 6–23)
CO2: 26 meq/L (ref 19–32)
Calcium: 9.7 mg/dL (ref 8.4–10.5)
Chloride: 105 meq/L (ref 96–112)
Creatinine, Ser: 1.17 mg/dL (ref 0.40–1.50)
GFR: 75.06 mL/min (ref >60.00)
Glucose, Bld: 93 mg/dL (ref 70–99)
Potassium: 4.6 meq/L (ref 3.5–5.1)
Sodium: 138 meq/L (ref 135–145)
Total Bilirubin: 0.7 mg/dL (ref 0.2–1.2)
Total Protein: 7 g/dL (ref 6.0–8.3)

## 2023-02-01 LAB — LIPID PANEL
Cholesterol: 145 mg/dL (ref 0–200)
HDL: 63.7 mg/dL (ref >39.00)
LDL Cholesterol: 70 mg/dL (ref 0–99)
NonHDL: 81.63
Total CHOL/HDL Ratio: 2
Triglycerides: 58 mg/dL (ref 0.0–149.0)
VLDL: 11.6 mg/dL (ref 0.0–40.0)

## 2023-02-01 LAB — CBC WITH DIFFERENTIAL/PLATELET
Basophils Absolute: 0 10*3/uL (ref 0.0–0.1)
Basophils Relative: 0.5 % (ref 0.0–3.0)
Eosinophils Absolute: 0.2 10*3/uL (ref 0.0–0.7)
Eosinophils Relative: 2.8 % (ref 0.0–5.0)
HCT: 46.5 % (ref 39.0–52.0)
Hemoglobin: 15.5 g/dL (ref 13.0–17.0)
Lymphocytes Relative: 30.9 % (ref 12.0–46.0)
Lymphs Abs: 2.1 10*3/uL (ref 0.7–4.0)
MCHC: 33.4 g/dL (ref 30.0–36.0)
MCV: 93.9 fl (ref 78.0–100.0)
Monocytes Absolute: 0.5 10*3/uL (ref 0.1–1.0)
Monocytes Relative: 7.5 % (ref 3.0–12.0)
Neutro Abs: 3.9 10*3/uL (ref 1.4–7.7)
Neutrophils Relative %: 58.3 % (ref 43.0–77.0)
Platelets: 445 10*3/uL — ABNORMAL HIGH (ref 150.0–400.0)
RBC: 4.95 Mil/uL (ref 4.22–5.81)
RDW: 13 % (ref 11.5–15.5)
WBC: 6.7 10*3/uL (ref 4.0–10.5)

## 2023-02-01 LAB — TYPE AND SCREEN
ABO/RH(D): O POS
Antibody Screen: NEGATIVE

## 2023-02-01 LAB — SURGICAL PCR SCREEN
MRSA, PCR: NEGATIVE
Staphylococcus aureus: POSITIVE — AB

## 2023-02-01 LAB — PSA: PSA: 1.06 ng/mL (ref 0.10–4.00)

## 2023-02-01 LAB — TSH: TSH: 0.79 u[IU]/mL (ref 0.35–5.50)

## 2023-02-01 MED ORDER — IOHEXOL 300 MG/ML  SOLN
100.0000 mL | Freq: Once | INTRAMUSCULAR | Status: AC | PRN
  Administered 2023-02-01: 100 mL via INTRAVENOUS

## 2023-02-01 NOTE — Patient Instructions (Signed)
Your procedure is scheduled on: Monday February 13, 2023.  Report to the Registration Desk on the 1st floor of the [facility]. To find out your arrival time, please call [phone] between 1PM - 3PM on: Friday February 10, 2023. If your arrival time is 6:00 am, do not arrive before that time as the [facility] entrance doors do not open until 6:00 am.  REMEMBER: Instructions that are not followed completely may result in serious medical risk, up to and including death; or upon the discretion of your surgeon and anesthesiologist your surgery may need to be rescheduled.  Do not eat food after midnight the night before surgery.  No gum chewing or hard candies.  You may however, drink CLEAR liquids up to 2 hours before you are scheduled to arrive for your surgery. Do not drink anything within 2 hours of your scheduled arrival time.  Clear liquids include: - water  - apple juice without pulp - gatorade (not RED colors) - black coffee or tea (Do NOT add milk or creamers to the coffee or tea) Do NOT drink anything that is not on this list.   One week prior to surgery: Stop Anti-inflammatories (NSAIDS) such as Advil, Aleve, Ibuprofen, Motrin, Naproxen, Naprosyn and Aspirin based products such as Excedrin, Goody's Powder, BC Powder. Stop ANY OVER THE COUNTER supplements until after surgery. You may however, continue to take Tylenol if needed for pain up until the day of surgery.  Continue taking all prescribed medications with the exception of the following:   Follow recommendations from Cardiologist or PCP regarding stopping blood thinners.  TAKE ONLY THESE MEDICATIONS THE MORNING OF SURGERY WITH A SIP OF WATER:  None   No Alcohol for 24 hours before or after surgery.  No Smoking including e-cigarettes for 24 hours before surgery.  No chewable tobacco products for at least 6 hours before surgery.  No nicotine patches on the day of surgery.  Do not use any "recreational"  drugs for at least a week (preferably 2 weeks) before your surgery.  Please be advised that the combination of cocaine and anesthesia may have negative outcomes, up to and including death. If you test positive for cocaine, your surgery will be cancelled.  On the morning of surgery brush your teeth with toothpaste and water, you may rinse your mouth with mouthwash if you wish. Do not swallow any toothpaste or mouthwash.  Use CHG Soap or wipes as directed on instruction sheet.  Do not wear jewelry, make-up, hairpins, clips or nail polish.  For welded (permanent) jewelry: bracelets, anklets, waist bands, etc.  Please have this removed prior to surgery.  If it is not removed, there is a chance that hospital personnel will need to cut it off on the day of surgery.  Do not wear lotions, powders, or perfumes.   Do not shave body hair from the neck down 48 hours before surgery.  Contact lenses, hearing aids and dentures may not be worn into surgery.  Do not bring valuables to the hospital. [facility] is not responsible for any missing/lost belongings or valuables.   Total Shoulder Arthroplasty:  use Benzoyl Peroxide 5% Gel as directed on instruction sheet.  Bring your C-PAP to the hospital in case you may have to spend the night.   Notify your doctor if there is any change in your medical condition (cold, fever, infection).  Wear comfortable clothing (specific to your surgery type) to the hospital.  After surgery, you can help prevent lung  complications by doing breathing exercises.  Take deep breaths and cough every 1-2 hours. Your doctor may order a device called an Incentive Spirometer to help you take deep breaths. When coughing or sneezing, hold a pillow firmly against your incision with both hands. This is called "splinting." Doing this helps protect your incision. It also decreases belly discomfort.  If you are being admitted to the hospital overnight, leave your suitcase in the car.  After surgery it may be brought to your room.  In case of increased patient census, it may be necessary for you, the patient, to continue your postoperative care in the Same Day Surgery department.  If you are being discharged the day of surgery, you will not be allowed to drive home. You will need a responsible individual to drive you home and stay with you for 24 hours after surgery.   If you are taking public transportation, you will need to have a responsible individual with you.  Please call the Pre-admissions Testing Dept. at [phone] if you have any questions about these instructions.  Surgery Visitation Policy:  Patients having surgery or a procedure may have two visitors.  Children under the age of 9 must have an adult with them who is not the patient.  Inpatient Visitation:    Visiting hours are 7 a.m. to 8 p.m. Up to four visitors are allowed at one time in a patient room. The visitors may rotate out with other people during the day.  One visitor age 51 or older may stay with the patient overnight and must be in the room by 8 p.m.    Pre-operative 5 CHG Bath Instructions   You can play a key role in reducing the risk of infection after surgery. Your skin needs to be as free of germs as possible. You can reduce the number of germs on your skin by washing with CHG (chlorhexidine gluconate) soap before surgery. CHG is an antiseptic soap that kills germs and continues to kill germs even after washing.   DO NOT use if you have an allergy to chlorhexidine/CHG or antibacterial soaps. If your skin becomes reddened or irritated, stop using the CHG and notify one of our RNs at [phone].   Please shower with the CHG soap starting 4 days before surgery using the following schedule:     Please keep in mind the following:  DO NOT shave, including legs and underarms, starting the day of your first shower.   You may shave your face at any point before/day of surgery.  Place  clean sheets on your bed the day you start using CHG soap. Use a clean washcloth (not used since being washed) for each shower. DO NOT sleep with pets once you start using the CHG.   CHG Shower Instructions:  If you choose to wash your hair and private area, wash first with your normal shampoo/soap.  After you use shampoo/soap, rinse your hair and body thoroughly to remove shampoo/soap residue.  Turn the water OFF and apply about 3 tablespoons (45 ml) of CHG soap to a CLEAN washcloth.  Apply CHG soap ONLY FROM YOUR NECK DOWN TO YOUR TOES (washing for 3-5 minutes)  DO NOT use CHG soap on face, private areas, open wounds, or sores.  Pay special attention to the area where your surgery is being performed.  If you are having back surgery, having someone wash your back for you may be helpful. Wait 2 minutes after CHG soap is applied, then you  may rinse off the CHG soap.  Pat dry with a clean towel  Put on clean clothes/pajamas   If you choose to wear lotion, please use ONLY the CHG-compatible lotions on the back of this paper.     Additional instructions for the day of surgery: DO NOT APPLY any lotions, deodorants, cologne, or perfumes.   Put on clean/comfortable clothes.  Brush your teeth.  Ask your nurse before applying any prescription medications to the skin.      CHG Compatible Lotions   Aveeno Moisturizing lotion  Cetaphil Moisturizing Cream  Cetaphil Moisturizing Lotion  Clairol Herbal Essence Moisturizing Lotion, Dry Skin  Clairol Herbal Essence Moisturizing Lotion, Extra Dry Skin  Clairol Herbal Essence Moisturizing Lotion, Normal Skin  Curel Age Defying Therapeutic Moisturizing Lotion with Alpha Hydroxy  Curel Extreme Care Body Lotion  Curel Soothing Hands Moisturizing Hand Lotion  Curel Therapeutic Moisturizing Cream, Fragrance-Free  Curel Therapeutic Moisturizing Lotion, Fragrance-Free  Curel Therapeutic Moisturizing Lotion, Original Formula  Eucerin Daily Replenishing  Lotion  Eucerin Dry Skin Therapy Plus Alpha Hydroxy Crme  Eucerin Dry Skin Therapy Plus Alpha Hydroxy Lotion  Eucerin Original Crme  Eucerin Original Lotion  Eucerin Plus Crme Eucerin Plus Lotion  Eucerin TriLipid Replenishing Lotion  Keri Anti-Bacterial Hand Lotion  Keri Deep Conditioning Original Lotion Dry Skin Formula Softly Scented  Keri Deep Conditioning Original Lotion, Fragrance Free Sensitive Skin Formula  Keri Lotion Fast Absorbing Fragrance Free Sensitive Skin Formula  Keri Lotion Fast Absorbing Softly Scented Dry Skin Formula  Keri Original Lotion  Keri Skin Renewal Lotion Keri Silky Smooth Lotion  Keri Silky Smooth Sensitive Skin Lotion  Nivea Body Creamy Conditioning Oil  Nivea Body Extra Enriched Teacher, adult education Moisturizing Lotion Nivea Crme  Nivea Skin Firming Lotion  NutraDerm 30 Skin Lotion  NutraDerm Skin Lotion  NutraDerm Therapeutic Skin Cream  NutraDerm Therapeutic Skin Lotion  ProShield Protective Hand Cream  Provon moisturizing lotion
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
## 2023-02-01 NOTE — Patient Instructions (Addendum)
Urgent CT ordered  Please stop by lab before you go If you have mychart- we will send your results within 3 business days of [place] receiving them.  If you do not have mychart- we will call you about results within 5 business days of [place] receiving them.  *please also note that you will see labs on mychart as soon as they post. I will later go in and write notes on them- will say "notes from Dr. Durene Cal"   If labs stable and CT ok will be able to clear for surgery. Id still follow up sooner than 1 year if continue to have weight loss- perhaps 3 months or whenever you feel better from surgery  Filled out Family Medical Leave Act [facility]) but on release side if I am doing release will need neurosurgery input  Recommended follow up: Return in about 1 year (around 02/01/2024) for physical or sooner if needed.Schedule b4 you leave.

## 2023-02-02 ENCOUNTER — Other Ambulatory Visit: Payer: Federal, State, Local not specified - PPO

## 2023-02-02 ENCOUNTER — Other Ambulatory Visit: Payer: Self-pay

## 2023-02-02 DIAGNOSIS — R634 Abnormal weight loss: Secondary | ICD-10-CM

## 2023-02-12 MED ORDER — CEFAZOLIN IN SODIUM CHLORIDE 2-0.9 GM/100ML-% IV SOLN
2.0000 g | Freq: Once | INTRAVENOUS | Status: DC
  Filled 2023-02-12: qty 100

## 2023-02-12 MED ORDER — FAMOTIDINE 20 MG PO TABS
20.0000 mg | ORAL_TABLET | Freq: Once | ORAL | Status: AC
  Administered 2023-02-13: 20 mg via ORAL

## 2023-02-12 MED ORDER — ORAL CARE MOUTH RINSE: 15.0000 mL | Freq: Once | OROMUCOSAL | Status: AC

## 2023-02-12 MED ORDER — CEFAZOLIN SODIUM-DEXTROSE 2-4 GM/100ML-% IV SOLN
2.0000 g | INTRAVENOUS | Status: AC
  Administered 2023-02-13: 2 g via INTRAVENOUS

## 2023-02-12 MED ORDER — CHLORHEXIDINE GLUCONATE 0.12 % MT SOLN
15.0000 mL | Freq: Once | OROMUCOSAL | Status: AC
  Administered 2023-02-13: 15 mL via OROMUCOSAL

## 2023-02-12 MED ORDER — LACTATED RINGERS IV SOLN: INTRAVENOUS | Status: DC

## 2023-02-13 ENCOUNTER — Other Ambulatory Visit: Payer: Self-pay

## 2023-02-13 ENCOUNTER — Ambulatory Visit: Payer: Federal, State, Local not specified - PPO | Admitting: Anesthesiology

## 2023-02-13 ENCOUNTER — Ambulatory Visit: Payer: Federal, State, Local not specified - PPO

## 2023-02-13 ENCOUNTER — Ambulatory Visit
Admission: RE | Admit: 2023-02-13 | Discharge: 2023-02-13 | Disposition: A | Discharge status: Home / Self Care | Payer: Federal, State, Local not specified - PPO | Attending: Neurosurgery | Admitting: Neurosurgery

## 2023-02-13 ENCOUNTER — Telehealth: Payer: Self-pay

## 2023-02-13 ENCOUNTER — Encounter: Payer: Self-pay | Admitting: Neurosurgery

## 2023-02-13 ENCOUNTER — Ambulatory Visit: Payer: Federal, State, Local not specified - PPO | Admitting: Urgent Care

## 2023-02-13 ENCOUNTER — Encounter
Admission: RE | Disposition: A | Discharge status: Home / Self Care | Payer: Self-pay | Source: Home / Self Care | Attending: Neurosurgery

## 2023-02-13 DIAGNOSIS — M4802 Spinal stenosis, cervical region: Secondary | ICD-10-CM | POA: Diagnosis not present

## 2023-02-13 DIAGNOSIS — M5412 Radiculopathy, cervical region: Secondary | ICD-10-CM | POA: Insufficient documentation

## 2023-02-13 DIAGNOSIS — M2578 Osteophyte, vertebrae: Secondary | ICD-10-CM | POA: Diagnosis not present

## 2023-02-13 DIAGNOSIS — Z01818 Encounter for other preprocedural examination: Secondary | ICD-10-CM

## 2023-02-13 DIAGNOSIS — Z0189 Encounter for other specified special examinations: Secondary | ICD-10-CM | POA: Diagnosis not present

## 2023-02-13 HISTORY — PX: CERVICAL DISC ARTHROPLASTY: SHX587

## 2023-02-13 SURGERY — CERVICAL ANTERIOR DISC ARTHROPLASTY
Anesthesia: General

## 2023-02-13 MED ORDER — FENTANYL CITRATE (PF) 100 MCG/2ML IJ SOLN
INTRAMUSCULAR | Status: DC | PRN
  Administered 2023-02-13: 100 ug via INTRAVENOUS

## 2023-02-13 MED ORDER — DROPERIDOL 2.5 MG/ML IJ SOLN: 0.6250 mg | Freq: Once | INTRAMUSCULAR | Status: DC | PRN

## 2023-02-13 MED ORDER — ACETAMINOPHEN 10 MG/ML IV SOLN
INTRAVENOUS | Status: DC | PRN
  Administered 2023-02-13: 1000 mg via INTRAVENOUS

## 2023-02-13 MED ORDER — OXYCODONE HCL 5 MG PO TABS
5.0000 mg | ORAL_TABLET | Freq: Once | ORAL | Status: AC | PRN
  Administered 2023-02-13: 5 mg via ORAL

## 2023-02-13 MED ORDER — FAMOTIDINE 20 MG PO TABS
ORAL_TABLET | ORAL | Status: AC
  Filled 2023-02-13: qty 1

## 2023-02-13 MED ORDER — PROPOFOL 10 MG/ML IV BOLUS
INTRAVENOUS | Status: DC | PRN
  Administered 2023-02-13: 200 mg via INTRAVENOUS
  Administered 2023-02-13: 150 ug/kg/min via INTRAVENOUS

## 2023-02-13 MED ORDER — REMIFENTANIL HCL 1 MG IV SOLR
INTRAVENOUS | Status: DC | PRN
Start: 2023-02-13 — End: 2023-02-13
  Administered 2023-02-13: .1 ug/kg/min via INTRAVENOUS

## 2023-02-13 MED ORDER — DEXAMETHASONE SODIUM PHOSPHATE 10 MG/ML IJ SOLN
INTRAMUSCULAR | Status: DC | PRN
  Administered 2023-02-13: 10 mg via INTRAVENOUS

## 2023-02-13 MED ORDER — BUPIVACAINE-EPINEPHRINE (PF) 0.5% -1:200000 IJ SOLN
INTRAMUSCULAR | Status: AC
  Filled 2023-02-13: qty 10

## 2023-02-13 MED ORDER — PROPOFOL 10 MG/ML IV BOLUS
INTRAVENOUS | Status: AC
  Filled 2023-02-13: qty 40

## 2023-02-13 MED ORDER — MIDAZOLAM HCL 2 MG/2ML IJ SOLN
INTRAMUSCULAR | Status: AC
  Filled 2023-02-13: qty 2

## 2023-02-13 MED ORDER — PROPOFOL 1000 MG/100ML IV EMUL
INTRAVENOUS | Status: AC
  Filled 2023-02-13: qty 100

## 2023-02-13 MED ORDER — SUCCINYLCHOLINE CHLORIDE 200 MG/10ML IV SOSY
PREFILLED_SYRINGE | INTRAVENOUS | Status: DC | PRN
  Administered 2023-02-13: 100 mg via INTRAVENOUS

## 2023-02-13 MED ORDER — OXYCODONE HCL 5 MG PO TABS
ORAL_TABLET | ORAL | Status: AC
  Filled 2023-02-13: qty 1

## 2023-02-13 MED ORDER — METHOCARBAMOL 500 MG PO TABS: 500.0000 mg | ORAL_TABLET | Freq: Four times a day (QID) | ORAL | 0 refills | Status: DC

## 2023-02-13 MED ORDER — FENTANYL CITRATE (PF) 100 MCG/2ML IJ SOLN
INTRAMUSCULAR | Status: AC
  Filled 2023-02-13: qty 2

## 2023-02-13 MED ORDER — NAPROXEN 500 MG PO TABS
500.0000 mg | ORAL_TABLET | Freq: Two times a day (BID) | ORAL | 0 refills | Status: DC
Start: 2023-02-13 — End: 2023-03-23

## 2023-02-13 MED ORDER — ACETAMINOPHEN 10 MG/ML IV SOLN
INTRAVENOUS | Status: AC
  Filled 2023-02-13: qty 100

## 2023-02-13 MED ORDER — REMIFENTANIL HCL 1 MG IV SOLR
INTRAVENOUS | Status: AC
  Filled 2023-02-13: qty 1000

## 2023-02-13 MED ORDER — PROMETHAZINE HCL 25 MG/ML IJ SOLN: 6.2500 mg | INTRAMUSCULAR | Status: DC | PRN

## 2023-02-13 MED ORDER — ONDANSETRON HCL 4 MG/2ML IJ SOLN
INTRAMUSCULAR | Status: DC | PRN
  Administered 2023-02-13: 4 mg via INTRAVENOUS

## 2023-02-13 MED ORDER — FENTANYL CITRATE (PF) 100 MCG/2ML IJ SOLN: 25.0000 ug | INTRAMUSCULAR | Status: DC | PRN

## 2023-02-13 MED ORDER — CHLORHEXIDINE GLUCONATE 0.12 % MT SOLN
OROMUCOSAL | Status: AC
  Filled 2023-02-13: qty 15

## 2023-02-13 MED ORDER — BUPIVACAINE-EPINEPHRINE (PF) 0.5% -1:200000 IJ SOLN
INTRAMUSCULAR | Status: DC | PRN
  Administered 2023-02-13: 8 mL via PERINEURAL

## 2023-02-13 MED ORDER — DEXAMETHASONE SODIUM PHOSPHATE 10 MG/ML IJ SOLN
INTRAMUSCULAR | Status: AC
  Filled 2023-02-13: qty 1

## 2023-02-13 MED ORDER — KETOROLAC TROMETHAMINE 30 MG/ML IJ SOLN
INTRAMUSCULAR | Status: DC | PRN
  Administered 2023-02-13: 30 mg via INTRAVENOUS

## 2023-02-13 MED ORDER — 0.9 % SODIUM CHLORIDE (POUR BTL) OPTIME
TOPICAL | Status: DC | PRN
  Administered 2023-02-13: 200 mL

## 2023-02-13 MED ORDER — MIDAZOLAM HCL 2 MG/2ML IJ SOLN
INTRAMUSCULAR | Status: DC | PRN
  Administered 2023-02-13: 2 mg via INTRAVENOUS

## 2023-02-13 MED ORDER — DEXMEDETOMIDINE HCL IN NACL 80 MCG/20ML IV SOLN
INTRAVENOUS | Status: DC | PRN
Start: 2023-02-13 — End: 2023-02-13
  Administered 2023-02-13 (×3): 8 ug via INTRAVENOUS

## 2023-02-13 MED ORDER — OXYCODONE HCL 5 MG/5ML PO SOLN: 5.0000 mg | Freq: Once | ORAL | Status: AC | PRN

## 2023-02-13 MED ORDER — SURGIFLO WITH THROMBIN (HEMOSTATIC MATRIX KIT) OPTIME
TOPICAL | Status: DC | PRN
  Administered 2023-02-13: 1 via TOPICAL

## 2023-02-13 MED ORDER — ACETAMINOPHEN 10 MG/ML IV SOLN: 1000.0000 mg | Freq: Once | INTRAVENOUS | Status: DC | PRN

## 2023-02-13 MED ORDER — LIDOCAINE HCL (CARDIAC) PF 100 MG/5ML IV SOSY
PREFILLED_SYRINGE | INTRAVENOUS | Status: DC | PRN
  Administered 2023-02-13: 100 mg via INTRAVENOUS

## 2023-02-13 MED ORDER — OXYCODONE HCL 5 MG PO TABS
5.0000 mg | ORAL_TABLET | ORAL | 0 refills | Status: AC | PRN
Start: 2023-02-13 — End: 2023-02-18

## 2023-02-13 MED ORDER — CEFAZOLIN SODIUM-DEXTROSE 2-4 GM/100ML-% IV SOLN
INTRAVENOUS | Status: AC
  Filled 2023-02-13: qty 100

## 2023-02-13 SURGICAL SUPPLY — 54 items
ADH SKN CLS APL DERMABOND .7 (GAUZE/BANDAGES/DRESSINGS) ×1
AGENT HMST KT MTR STRL THRMB (HEMOSTASIS) ×1
APL PRP STRL LF DISP 70% ISPRP (MISCELLANEOUS) ×2
BUR NEURO DRILL SOFT 3.0X3.8M (BURR) ×1 IMPLANT
CHLORAPREP W/TINT 26 (MISCELLANEOUS) ×2 IMPLANT
COUNTER NEEDLE 20/40 LG (NEEDLE) ×1 IMPLANT
DERMABOND ADVANCED .7 DNX12 (GAUZE/BANDAGES/DRESSINGS) ×1 IMPLANT
DISC MOBI-C CERVICAL 15X17 H5 (Miscellaneous) IMPLANT
DRAPE C ARM PK CFD 31 SPINE (DRAPES) ×1 IMPLANT
DRAPE LAPAROTOMY 77X122 PED (DRAPES) ×1 IMPLANT
DRAPE MICROSCOPE SPINE 48X150 (DRAPES) ×1 IMPLANT
DRAPE SURG 17X11 SM STRL (DRAPES) ×1 IMPLANT
DRSG OPSITE POSTOP 4X6 (GAUZE/BANDAGES/DRESSINGS) ×2 IMPLANT
ELECT CAUTERY BLADE TIP 2.5 (TIP) ×1
ELECT REM PT RETURN 9FT ADLT (ELECTROSURGICAL) ×1
ELECTRODE CAUTERY BLDE TIP 2.5 (TIP) ×1 IMPLANT
ELECTRODE REM PT RTRN 9FT ADLT (ELECTROSURGICAL) ×1 IMPLANT
FEE INTRAOP CADWELL SUPPLY NCS (MISCELLANEOUS) IMPLANT
FEE INTRAOP MONITOR IMPULS NCS (MISCELLANEOUS) IMPLANT
GLOVE BIOGEL PI IND STRL 6.5 (GLOVE) ×1 IMPLANT
GLOVE BIOGEL PI IND STRL 8.5 (GLOVE) ×2 IMPLANT
GLOVE SURG SYN 6.5 ES PF (GLOVE) ×1 IMPLANT
GLOVE SURG SYN 6.5 PF PI (GLOVE) ×1 IMPLANT
GLOVE SURG SYN 8.5 E (GLOVE) ×3 IMPLANT
GLOVE SURG SYN 8.5 PF PI (GLOVE) ×3 IMPLANT
GOWN SRG LRG LVL 4 IMPRV REINF (GOWNS) ×1 IMPLANT
GOWN SRG XL LVL 3 NONREINFORCE (GOWNS) ×1 IMPLANT
GOWN STRL NON-REIN TWL XL LVL3 (GOWNS) ×1
GOWN STRL REIN LRG LVL4 (GOWNS) ×1
GRADUATE 1200CC STRL 31836 (MISCELLANEOUS) ×1 IMPLANT
INTRAOP CADWELL SUPPLY FEE NCS (MISCELLANEOUS)
INTRAOP DISP SUPPLY FEE NCS (MISCELLANEOUS)
INTRAOP MONITOR FEE IMPULS NCS (MISCELLANEOUS)
JET LAVAGE IRRISEPT WOUND (IRRIGATION / IRRIGATOR) ×1
KIT TURNOVER KIT A (KITS) ×1 IMPLANT
LAVAGE JET IRRISEPT WOUND (IRRIGATION / IRRIGATOR) ×1 IMPLANT
MANIFOLD NEPTUNE II (INSTRUMENTS) ×1 IMPLANT
MARKER SKIN DUAL TIP RULER LAB (MISCELLANEOUS) ×2 IMPLANT
NDL SAFETY ECLIP 18X1.5 (MISCELLANEOUS) IMPLANT
NS IRRIG 1000ML POUR BTL (IV SOLUTION) ×1 IMPLANT
PACK LAMINECTOMY ARMC (PACKS) ×1 IMPLANT
PAD ARMBOARD 7.5X6 YLW CONV (MISCELLANEOUS) ×1 IMPLANT
PIN CASPAR SPINAL 14MM CERVICA (PIN) IMPLANT
SPONGE KITTNER 5P (MISCELLANEOUS) ×1 IMPLANT
STAPLER SKIN PROX 35W (STAPLE) IMPLANT
SURGIFLO W/THROMBIN 8M KIT (HEMOSTASIS) ×1 IMPLANT
SUT DVC VLOC 3-0 CL 6 P-12 (SUTURE) IMPLANT
SUT VIC AB 3-0 SH 8-18 (SUTURE) ×1 IMPLANT
SYR 30ML LL (SYRINGE) ×1 IMPLANT
TAPE CLOTH 3X10 WHT NS LF (GAUZE/BANDAGES/DRESSINGS) ×1 IMPLANT
TOWEL OR 17X26 4PK STRL BLUE (TOWEL DISPOSABLE) ×3 IMPLANT
TRAP FLUID SMOKE EVACUATOR (MISCELLANEOUS) ×1 IMPLANT
TRAY FOLEY SLVR 16FR LF STAT (SET/KITS/TRAYS/PACK) IMPLANT
TUBING CONNECTING 10 (TUBING) ×1 IMPLANT

## 2023-02-13 NOTE — Progress Notes (Signed)
Patient states symptoms he had preoperatively are gone including numbness in his 3rd and fourth fingers.

## 2023-02-13 NOTE — Transfer of Care (Signed)
Immediate Anesthesia Transfer of Care Note  Patient: Manjinder Breau III  Procedure(s) Performed: C5-7 ARTHROPLASTY  Patient Location: PACU  Anesthesia Type:General  Level of Consciousness: drowsy  Airway & Oxygen Therapy: Patient Spontanous Breathing and Patient connected to face mask oxygen  Post-op Assessment: Report given to RN, Post -op Vital signs reviewed and stable, and Patient moving all extremities  Post vital signs: Reviewed and stable  Last Vitals:  Vitals Value Taken Time  BP 92/58 02/13/23 0941  Temp    Pulse 59 02/13/23 0945  Resp 17 02/13/23 0945  SpO2 98 % 02/13/23 0945  Vitals shown include unfiled device data.  Last Pain:  Vitals:   02/13/23 4098  PainSc: 0-No pain         Complications: No notable events documented.

## 2023-02-13 NOTE — Anesthesia Postprocedure Evaluation (Signed)
Anesthesia Post Note  Patient: Brandon Clayton  Procedure(s) Performed: C5-7 ARTHROPLASTY  Patient location during evaluation: PACU Anesthesia Type: General Level of consciousness: awake and alert Pain management: pain level controlled Vital Signs Assessment: post-procedure vital signs reviewed and stable Respiratory status: spontaneous breathing, nonlabored ventilation and respiratory function stable Cardiovascular status: blood pressure returned to baseline and stable Postop Assessment: no apparent nausea or vomiting Anesthetic complications: no   No notable events documented.   Last Vitals:  Vitals:   02/13/23 1031 02/13/23 1338  BP: 115/88 118/79  Pulse: 64 60  Resp: 12 15  Temp: 36.4 C (!) 36.2 C  SpO2: 98% 99%    Last Pain:  Vitals:   02/13/23 1338  TempSrc: Temporal  PainSc: 0-No pain                 Foye Deer

## 2023-02-13 NOTE — Discharge Instructions (Addendum)
Your surgeon has performed an operation on your cervical spine (neck) to relieve pressure on the spinal cord and/or nerves. This involved making an incision in the front of your neck and removing one or more of the discs that support your spine. Next, a small piece of bone, a titanium plate, and screws were used to fuse two or more of the vertebrae (bones) together.  The following are instructions to help in your recovery once you have been discharged from the hospital. Even if you feel well, it is important that you follow these activity guidelines. If you do not let your neck heal properly from the surgery, you can increase the chance of return of your symptoms and other complications.  Activity    No bending, lifting, or twisting ("BLT"). Avoid lifting objects heavier than 10 pounds (gallon milk jug).  Where possible, avoid household activities that involve lifting, bending, reaching, pushing, or pulling such as laundry, vacuuming, grocery shopping, and childcare. Try to arrange for help from friends and family for these activities while your back heals.  Increase physical activity slowly as tolerated.  Taking short walks is encouraged, but avoid strenuous exercise. Do not jog, run, bicycle, lift weights, or participate in any other exercises unless specifically allowed by your doctor.  Talk to your doctor before resuming sexual activity.  You should not drive until cleared by your doctor.  Until released by your doctor, you should not return to work or school.  You should rest at home and let your body heal.   You may shower three days after your surgery.  After showering, lightly dab your incision dry. Do not take a tub bath or go swimming until approved by your doctor at your follow-up appointment.  If your doctor ordered a cervical collar (neck brace) for you, you should wear it whenever you are out of bed. You may remove it when lying down or sleeping, but you should wear it at all other  times. Not all neck surgeries require a cervical collar.  If you smoke, we strongly recommend that you quit.  Smoking has been proven to interfere with normal bone healing and will dramatically reduce the success rate of your surgery. Please contact [facility] (800-QUIT-NOW) and use the resources at www.[facility].com for assistance in stopping smoking.  Surgical Incision   If you have a dressing on your incision, you may remove it two days after your surgery. Keep your incision area clean and dry.  If you have staples or stitches on your incision, you should have a follow up scheduled for removal. If you do not have staples or stitches, you will have steri-strips (small pieces of surgical tape) or Dermabond glue. The steri-strips/glue should begin to peel away within about a week (it is fine if the steri-strips fall off before then). If the strips are still in place one week after your surgery, you may gently remove them.  Diet           You may return to your usual diet. However, you may experience discomfort when swallowing in the first month after your surgery. This is normal. You may find that softer foods are more comfortable for you to swallow. Be sure to stay hydrated.  When to Contact us  You may experience pain in your neck and/or pain between your shoulder blades. This is normal and should improve in the next few weeks with the help of pain medication, muscle relaxers, and rest. Some patients report that a warm compress  on the back of the neck or between the shoulder blades helps.  However, should you experience any of the following, contact us immediately: New numbness or weakness Pain that is progressively getting worse, and is not relieved by your pain medication, muscle relaxers, rest, and warm compresses Bleeding, redness, swelling, pain, or drainage from surgical incision Chills or flu-like symptoms Fever greater than 101.0 F (38.3 C) Inability to eat, drink fluids, or take  medications Problems with bowel or bladder functions Difficulty breathing or shortness of breath Warmth, tenderness, or swelling in your calf Contact Information How to contact us:  If you have any questions/concerns before or after surgery, you can reach [place] at [phone], or you can send a mychart message. We can be reached by phone or mychart 8am-4pm, Monday-Friday.  *Please note: Calls after 4pm are forwarded to a third party answering service. Mychart messages are not routinely monitored during evenings, weekends, and holidays. Please call our office to contact the answering service for urgent concerns during non-business hours.    AMBULATORY SURGERY  DISCHARGE INSTRUCTIONS   The drugs that you were given will stay in your system until tomorrow so for the next 24 hours you should not:  Drive an automobile Make any legal decisions Drink any alcoholic beverage   You may resume regular meals tomorrow.  Today it is better to start with liquids and gradually work up to solid foods.  You may eat anything you prefer, but it is better to start with liquids, then soup and crackers, and gradually work up to solid foods.   Please notify your doctor immediately if you have any unusual bleeding, trouble breathing, redness and pain at the surgery site, drainage, fever, or pain not relieved by medication.    Additional Instructions:   Please contact your physician with any problems or Same Day Surgery at [phone], Monday through Friday 6 am to 4 pm, or [place] at [facility] number at [phone].
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
## 2023-02-13 NOTE — Progress Notes (Signed)
Patient ambulated to restroom. Urinated, ambulated back to post-op bay 11, and resuming lunch.

## 2023-02-13 NOTE — Telephone Encounter (Signed)
PA for Oxycodone has been started on Cover My Meds and is currently pending.   PA Case ID #: [id number]

## 2023-02-13 NOTE — Discharge Summary (Signed)
Discharge Summary  Patient ID: Brandon Clayton MRN: [id number] DOB/AGE: November 28, 1975 47 y.o.  Admit date: 02/13/2023 Discharge date: 02/13/2023  Admission Diagnoses: M54.12 Cervical radiculopathy , M48.02 Cervical stenosis of spinal canal.   Discharge Diagnoses:  Active Problems:   Cervical radiculopathy   Cervical spinal stenosis   Discharged Condition: good  Hospital Course: ***  Consults: None  Significant Diagnostic Studies: none  Treatments: surgery: as above. Please see separately dictated operative report for further details.  Discharge Exam: Blood pressure 114/80, pulse 66, temperature (!) 97.4 F (36.3 C), resp. rate 17, SpO2 96%. {physical [id number]}  Disposition: Discharge disposition: 01-Home or Self Care        Allergies as of 02/13/2023   No Known Allergies      Medication List     TAKE these medications    acetaminophen 500 MG tablet Commonly known as: TYLENOL Take 500 mg by mouth every 6 (six) hours as needed.   methocarbamol 500 MG tablet Commonly known as: ROBAXIN Take 1 tablet (500 mg total) by mouth 4 (four) times daily.   naproxen 500 MG tablet Commonly known as: Naprosyn Take 1 tablet (500 mg total) by mouth 2 (two) times daily with a meal.   oxyCODONE 5 MG immediate release tablet Commonly known as: Roxicodone Take 1 tablet (5 mg total) by mouth every 4 (four) hours as needed for up to 5 days for severe pain.        Follow-up Information     Drake Leach, PA-C Follow up on 02/28/2023.   Specialty: Neurosurgery Contact information: [address] [place] [address] [phone]                 Signed: Susanne Borders 02/13/2023, 9:29 AM

## 2023-02-13 NOTE — Op Note (Signed)
Indications: [name] is a 47 y.o. male with M54.12 Cervical radiculopathy , M48.02 Cervical stenosis of spinal canal. He failed conservative management prompting surgical intervention.  Findings: disc herniation C67  Preoperative Diagnosis: M54.12 Cervical radiculopathy , M48.02 Cervical stenosis of spinal canal  Postoperative Diagnosis: same   EBL: 25 ml IVF: see AR Drains: none Disposition: Extubated and Stable to PACU Complications: none  No foley catheter was placed.   Preoperative Note:   Risks of surgery discussed include: infection, bleeding, stroke, coma, death, paralysis, CSF leak, nerve/spinal cord injury, numbness, tingling, weakness, complex regional pain syndrome, recurrent stenosis and/or disc herniation, vascular injury, development of instability, neck/back pain, need for further surgery, persistent symptoms, development of deformity, and the risks of anesthesia. The patient understood these risks and agreed to proceed.  Operative Note:   Operative Note:  Procedure:  1) Cervical Disc Arthroplasty at C5/6 and C6/7 using a LDR Mobi-C device   Procedure: After obtaining informed consent, the patient taken to the operating room, placed in supine position, general anesthesia induced.  The patient had a small shoulder roll placed behind the neck.  The patient received preop antibiotics and IV Decadron.  The patient had a neck incision outlined, was prepped and draped in usual sterile fashion. The incision was injected with local anesthetic.   An incision was opened, dissection taken down medial to the carotid artery and jugular vein, lateral to the trachea and esophagus.  The prevertebral fascia identified and a localizing x-ray demonstrated the correct level.  The longus colli were dissected laterally, and self-retaining retractors placed to open the operative field. The microscope was then brought into the field.  With this complete, distractor pins were placed in  the vertebral bodies of C5 and C7. The distractor was placed, then the annulus at C5/6 was opened using a bovie.  Curettes and pituitary rongeurs used to remove the majority of disk, then the drill was used to remove the posterior osteophyte and begin the foraminotomies. The nerve hook was used to elevate the posterior longitudinal ligament, which was then removed with Kerrison rongeurs. The microblunt nerve hook could be passed out the foramen bilateral at each level.   Meticulous hemostasis was obtained.  A trial spacer was used to size the disc space. Using flouroscopic guidance, a 17 mm width x 15 mm depth x 5 mm height Mobi-C was then inserted in the prepared disc space.  Then, the annulus at C6/7 was opened using a bovie.  Curettes and pituitary rongeurs used to remove the majority of disk, then the drill was used to remove the posterior osteophyte and begin the foraminotomies. The nerve hook was used to elevate the posterior longitudinal ligament, which was then removed with Kerrison rongeurs. The microblunt nerve hook could be passed out the foramen bilateral at each level.   Meticulous hemostasis was obtained.  A trial spacer was used to size the disc space. Using flouroscopic guidance, a 17 mm width x 15 mm depth x 5 mm height Mobi-C was then inserted in the prepared disc space.  The caspar distractor was removed, and bone wax used for hemostasis. Final AP and lateral radiographs were taken.   With the disc arthroplasties in good position, the wound was irrigated copiously and meticulous hemostasis obtained.  Wound was closed in 2 layers using interrupted inverted 3-0 Vicryl sutures.  The wound was dressed with dermabond, the head of bed at 30 degrees, taken to recovery room in stable condition.  No new postop  neurological deficits were identified.  Sponge and pattie counts were correct at the end of the procedure.   I performed the entire procedure with the assistance of Manning Charity PA as an  Designer, television/film set. An assistant was required for this procedure due to the complexity.  The assistant provided assistance in tissue manipulation and suction, and was required for the successful and safe performance of the procedure. I performed the critical portions of the procedure.   Venetia Night MD

## 2023-02-13 NOTE — Interval H&P Note (Signed)
History and Physical Interval Note:  02/13/2023 7:07 AM  Brandon Clayton  has presented today for surgery, with the diagnosis of M54.12  Cervical radiculopathy  M48.02 Cervical stenosis of spinal canal.  The various methods of treatment have been discussed with the patient and family. After consideration of risks, benefits and other options for treatment, the patient has consented to  Procedure(s): C5-7 ARTHROPLASTY (N/A) as a surgical intervention.  The patient's history has been reviewed, patient examined, no change in status, stable for surgery.  I have reviewed the patient's chart and labs.  Questions were answered to the patient's satisfaction.    Heart sounds normal no MRG. Chest Clear to Auscultation Bilaterally.   Brandon Clayton

## 2023-02-13 NOTE — Anesthesia Procedure Notes (Signed)
Procedure Name: Intubation Date/Time: 02/13/2023 7:28 AM  Performed by: Katherine Basset, CRNAPre-anesthesia Checklist: Patient identified, Emergency Drugs available, Suction available and Patient being monitored Patient Re-evaluated:Patient Re-evaluated prior to induction Oxygen Delivery Method: Circle system utilized Preoxygenation: Pre-oxygenation with 100% oxygen Induction Type: IV induction Laryngoscope Size: McGraph and 3 Grade View: Grade I Tube type: Oral Tube size: 7.5 mm Number of attempts: 1 Airway Equipment and Method: Stylet, Oral airway, LTA kit utilized and Bite block Placement Confirmation: ETT inserted through vocal cords under direct vision, positive ETCO2 and breath sounds checked- equal and bilateral Secured at: 22 cm Tube secured with: Tape Dental Injury: Teeth and Oropharynx as per pre-operative assessment  Comments: Head/neck maintained in neutral position throughout induction/intubation

## 2023-02-14 ENCOUNTER — Encounter: Payer: Self-pay | Admitting: Neurosurgery

## 2023-02-14 NOTE — Telephone Encounter (Signed)
Noted  

## 2023-02-14 NOTE — Telephone Encounter (Signed)
The oxycodone has been denied by his insurance, before we try to do a peer to peer, can you ask him if he paid out of pocket for this? If paying out of pocket is not a option for him, let me know and we can continue with a peer to peer.

## 2023-02-14 NOTE — Telephone Encounter (Signed)
His has the prescription at home so he thinks his wife paid for it out of pocket.

## 2023-02-20 ENCOUNTER — Encounter: Payer: Self-pay | Admitting: Neurosurgery

## 2023-02-25 NOTE — Progress Notes (Unsigned)
   REFERRING PHYSICIAN:  Shelva Majestic, Md [address] [place],  [place] [address]  DOS: 02/13/23  Cervical disc arthroplasty C5-C6 and C6-C7  HISTORY OF PRESENT ILLNESS: Brandon Clayton is 2 weeks status post above surgery. Given oxycodone, robaxin, and naproxen on discharge from the hospital.   His preop right arm pain/numbness/tingling is gone. No neck pain. He is having some right shoulder pain. Swallowing is getting better. Able to swallow pills, has to chew up food well.   He is not taking oxycodone or robaxin. Taking naproxen prn.    PHYSICAL EXAMINATION:  NEUROLOGICAL:  General: In no acute distress.   Awake, alert, oriented to person, place, and time.  Pupils equal round and reactive to light.  Facial tone is symmetric.    Strength: Side Biceps Triceps Deltoid Interossei Grip Wrist Ext. Wrist Flex.  R 5 5 5 5 5 5 5   L 5 5 5 5 5 5 5    Incision c/d/i  Imaging:  Nothing new to review.   Assessment / Plan: Brandon Clayton is doing well s/p above surgery. Treatment options reviewed with patient and following plan made:   - We discussed activity escalation and I have advised the patient to lift up to 10 pounds until 6 weeks after surgery (until follow up with Dr. Myer Haff).   - Reviewed wound care.  - Continue current medications including naproxen - Follow up as scheduled in 4 weeks and prn.   Advised to contact the office if any questions or concerns arise.   Drake Leach PA-C Dept of Neurosurgery

## 2023-02-28 ENCOUNTER — Encounter: Payer: Self-pay | Admitting: Orthopedic Surgery

## 2023-02-28 ENCOUNTER — Ambulatory Visit (INDEPENDENT_AMBULATORY_CARE_PROVIDER_SITE_OTHER): Payer: Federal, State, Local not specified - PPO | Admitting: Orthopedic Surgery

## 2023-02-28 VITALS — BP 110/80 | Ht 69.0 in | Wt 162.0 lb

## 2023-02-28 DIAGNOSIS — Z09 Encounter for follow-up examination after completed treatment for conditions other than malignant neoplasm: Secondary | ICD-10-CM

## 2023-02-28 DIAGNOSIS — M5412 Radiculopathy, cervical region: Secondary | ICD-10-CM

## 2023-02-28 DIAGNOSIS — Z9889 Other specified postprocedural states: Secondary | ICD-10-CM

## 2023-03-22 ENCOUNTER — Other Ambulatory Visit: Payer: Self-pay

## 2023-03-22 DIAGNOSIS — M5412 Radiculopathy, cervical region: Secondary | ICD-10-CM

## 2023-03-23 ENCOUNTER — Encounter: Payer: Self-pay | Admitting: Neurosurgery

## 2023-03-23 ENCOUNTER — Ambulatory Visit
Admission: RE | Admit: 2023-03-23 | Discharge: 2023-03-23 | Disposition: A | Discharge status: Home / Self Care | Payer: Federal, State, Local not specified - PPO | Source: Ambulatory Visit | Attending: Neurosurgery | Admitting: Neurosurgery

## 2023-03-23 ENCOUNTER — Ambulatory Visit
Admission: RE | Admit: 2023-03-23 | Discharge: 2023-03-23 | Disposition: A | Discharge status: Home / Self Care | Payer: Federal, State, Local not specified - PPO | Attending: Neurosurgery | Admitting: Neurosurgery

## 2023-03-23 ENCOUNTER — Ambulatory Visit (INDEPENDENT_AMBULATORY_CARE_PROVIDER_SITE_OTHER): Payer: Federal, State, Local not specified - PPO | Admitting: Neurosurgery

## 2023-03-23 VITALS — BP 128/80 | Ht 69.0 in | Wt 162.0 lb

## 2023-03-23 DIAGNOSIS — M5412 Radiculopathy, cervical region: Secondary | ICD-10-CM | POA: Insufficient documentation

## 2023-03-23 DIAGNOSIS — M4322 Fusion of spine, cervical region: Secondary | ICD-10-CM | POA: Diagnosis not present

## 2023-03-23 DIAGNOSIS — Z09 Encounter for follow-up examination after completed treatment for conditions other than malignant neoplasm: Secondary | ICD-10-CM

## 2023-03-23 DIAGNOSIS — M2578 Osteophyte, vertebrae: Secondary | ICD-10-CM | POA: Diagnosis not present

## 2023-03-23 NOTE — Progress Notes (Signed)
   REFERRING PHYSICIAN:  Shelva Majestic, Md [address] [place],  [place] [address]  DOS: 02/13/23  Cervical disc arthroplasty C5-C6 and C6-C7  HISTORY OF PRESENT ILLNESS: Brandon Clayton is status post above surgery.  He is doing very well.  His arms feel much better.    PHYSICAL EXAMINATION:  NEUROLOGICAL:  General: In no acute distress.   Awake, alert, oriented to person, place, and time.  Pupils equal round and reactive to light.  Facial tone is symmetric.    Strength: Side Biceps Triceps Deltoid Interossei Grip Wrist Ext. Wrist Flex.  R 5 5 5 5 5 5 5   L 5 5 5 5 5 5 5    Incision c/d/i  Imaging:  No complications noted  Assessment / Plan: Brandon Clayton is doing well s/p above surgery.  I have given him exercises for his neck.  We discussed return to work as well as activity restrictions.  He will begin increasing his activity level and return to work next week.  Will see him back in clinic in 6 weeks.   Venetia Night MD Dept of Neurosurgery

## 2023-03-24 ENCOUNTER — Other Ambulatory Visit: Payer: Self-pay | Admitting: Medical Genetics

## 2023-03-24 ENCOUNTER — Other Ambulatory Visit
Admission: RE | Admit: 2023-03-24 | Discharge: 2023-03-24 | Disposition: A | Discharge status: Home / Self Care | Payer: Self-pay | Source: Ambulatory Visit | Attending: Medical Genetics | Admitting: Medical Genetics

## 2023-03-24 DIAGNOSIS — Z006 Encounter for examination for normal comparison and control in clinical research program: Secondary | ICD-10-CM

## 2023-04-04 LAB — HELIX MOLECULAR SCREEN: Genetic Analysis Overall Interpretation: NEGATIVE

## 2023-04-04 LAB — GENECONNECT MOLECULAR SCREEN

## 2023-04-18 ENCOUNTER — Encounter: Payer: Self-pay | Admitting: Internal Medicine

## 2023-04-18 ENCOUNTER — Ambulatory Visit: Payer: Federal, State, Local not specified - PPO | Admitting: Internal Medicine

## 2023-04-18 VITALS — BP 120/84 | HR 75 | Ht 69.0 in | Wt 171.1 lb

## 2023-04-18 DIAGNOSIS — Z860101 Personal history of adenomatous and serrated colon polyps: Secondary | ICD-10-CM | POA: Diagnosis not present

## 2023-04-18 DIAGNOSIS — R109 Unspecified abdominal pain: Secondary | ICD-10-CM

## 2023-04-18 DIAGNOSIS — R634 Abnormal weight loss: Secondary | ICD-10-CM | POA: Diagnosis not present

## 2023-04-18 DIAGNOSIS — R935 Abnormal findings on diagnostic imaging of other abdominal regions, including retroperitoneum: Secondary | ICD-10-CM

## 2023-04-18 DIAGNOSIS — R933 Abnormal findings on diagnostic imaging of other parts of digestive tract: Secondary | ICD-10-CM | POA: Diagnosis not present

## 2023-04-18 NOTE — Patient Instructions (Signed)
You have been scheduled for an endoscopy. Please follow written instructions given to you at your visit today.  If you use inhalers (even only as needed), please bring them with you on the day of your procedure.  If you take any of the following medications, they will need to be adjusted prior to your procedure:   DO NOT TAKE 7 DAYS PRIOR TO TEST- Trulicity (dulaglutide) Ozempic, Wegovy (semaglutide) Mounjaro (tirzepatide) Bydureon Bcise (exanatide extended release)  DO NOT TAKE 1 DAY PRIOR TO YOUR TEST Rybelsus (semaglutide) Adlyxin (lixisenatide) Victoza (liraglutide) Byetta (exanatide) ___________________________________________________________________________   _______________________________________________________  If your blood pressure at your visit was 140/90 or greater, please contact your primary care physician to follow up on this.  _______________________________________________________  If you are age 46 or older, your body mass index should be between 23-30. Your Body mass index is 25.27 kg/m. If this is out of the aforementioned range listed, please consider follow up with your Primary Care Provider.  If you are age 92 or younger, your body mass index should be between 19-25. Your Body mass index is 25.27 kg/m. If this is out of the aformentioned range listed, please consider follow up with your Primary Care Provider.   ________________________________________________________  The [facility] providers would like to encourage you to use [facility] to communicate with providers for non-urgent requests or questions.  Due to long hold times on the telephone, sending your provider a message by [facility] may be a faster and more efficient way to get a response.  Please allow 48 business hours for a response.  Please remember that this is for non-urgent requests.  _______________________________________________________
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
## 2023-05-03 ENCOUNTER — Other Ambulatory Visit: Payer: Self-pay | Admitting: Orthopedic Surgery

## 2023-05-03 DIAGNOSIS — M5412 Radiculopathy, cervical region: Secondary | ICD-10-CM

## 2023-05-03 DIAGNOSIS — Z9889 Other specified postprocedural states: Secondary | ICD-10-CM

## 2023-05-03 NOTE — Progress Notes (Unsigned)
   REFERRING PHYSICIAN:  Shelva Majestic, Md [address] [place],  [place] [address]  DOS: 02/13/23  Cervical disc arthroplasty C5-C6 and C6-C7  HISTORY OF PRESENT ILLNESS:  He was doing well at his last visit with improvement in his right arm pain.   He is doing great with no significant neck or arm pain. He is back at work and has some minimal soreness. No numbness, tingling, or weakness.    PHYSICAL EXAMINATION:  NEUROLOGICAL:  General: In no acute distress.   Awake, alert, oriented to person, place, and time.  Pupils equal round and reactive to light.  Facial tone is symmetric.    Strength: Side Biceps Triceps Deltoid Interossei Grip Wrist Ext. Wrist Flex.  R 5 5 5 5 5 5 5   L 5 5 5 5 5 5 5    Incision well healed  Imaging:  Cervical xrays dated 05/04/23:  No complications noted.   Report not yet available for above xrays.  Assessment / Plan: Alma Polino III is doing well s/p above surgery. Treatment options reviewed with patient and following plan made:   - He can return to normal activities slowly.  - Follow up in 6 months with repeat xrays.   Advised to contact the office if any questions or concerns arise.   Drake Leach PA-C Dept of Neurosurgery

## 2023-05-04 ENCOUNTER — Ambulatory Visit (INDEPENDENT_AMBULATORY_CARE_PROVIDER_SITE_OTHER): Payer: Federal, State, Local not specified - PPO | Admitting: Orthopedic Surgery

## 2023-05-04 ENCOUNTER — Ambulatory Visit
Admission: RE | Admit: 2023-05-04 | Discharge: 2023-05-04 | Disposition: A | Discharge status: Home / Self Care | Payer: Federal, State, Local not specified - PPO | Attending: Orthopedic Surgery | Admitting: Orthopedic Surgery

## 2023-05-04 ENCOUNTER — Encounter: Payer: Self-pay | Admitting: Orthopedic Surgery

## 2023-05-04 ENCOUNTER — Ambulatory Visit
Admission: RE | Admit: 2023-05-04 | Discharge: 2023-05-04 | Disposition: A | Discharge status: Home / Self Care | Payer: Federal, State, Local not specified - PPO | Source: Ambulatory Visit | Attending: Orthopedic Surgery | Admitting: Orthopedic Surgery

## 2023-05-04 VITALS — BP 112/74 | Ht 69.0 in | Wt 171.0 lb

## 2023-05-04 DIAGNOSIS — Z9889 Other specified postprocedural states: Secondary | ICD-10-CM

## 2023-05-04 DIAGNOSIS — Z4789 Encounter for other orthopedic aftercare: Secondary | ICD-10-CM | POA: Diagnosis not present

## 2023-05-04 DIAGNOSIS — M5412 Radiculopathy, cervical region: Secondary | ICD-10-CM

## 2023-05-04 DIAGNOSIS — Z981 Arthrodesis status: Secondary | ICD-10-CM | POA: Diagnosis not present

## 2023-05-23 ENCOUNTER — Encounter: Payer: Self-pay | Admitting: Internal Medicine

## 2023-05-30 ENCOUNTER — Encounter: Payer: Self-pay | Admitting: Internal Medicine

## 2023-05-30 ENCOUNTER — Ambulatory Visit (AMBULATORY_SURGERY_CENTER): Payer: Federal, State, Local not specified - PPO | Admitting: Internal Medicine

## 2023-05-30 VITALS — BP 103/71 | HR 77 | Temp 99.0°F | Resp 16 | Ht 69.0 in | Wt 171.0 lb

## 2023-05-30 DIAGNOSIS — R109 Unspecified abdominal pain: Secondary | ICD-10-CM

## 2023-05-30 DIAGNOSIS — K449 Diaphragmatic hernia without obstruction or gangrene: Secondary | ICD-10-CM

## 2023-05-30 DIAGNOSIS — R935 Abnormal findings on diagnostic imaging of other abdominal regions, including retroperitoneum: Secondary | ICD-10-CM

## 2023-05-30 DIAGNOSIS — R933 Abnormal findings on diagnostic imaging of other parts of digestive tract: Secondary | ICD-10-CM | POA: Diagnosis not present

## 2023-05-30 DIAGNOSIS — R634 Abnormal weight loss: Secondary | ICD-10-CM

## 2023-05-30 MED ORDER — SODIUM CHLORIDE 0.9 % IV SOLN: 500.0000 mL | INTRAVENOUS | Status: DC

## 2023-05-30 NOTE — Progress Notes (Signed)
 Pt's states no medical or surgical changes since previsit or office visit.

## 2023-05-30 NOTE — Op Note (Signed)
 [facility] Patient Name: Brandon Clayton Procedure Date: 05/30/2023 10:05 AM MRN: [id number] Endoscopist: Norleen SAILOR. Abran , MD, [id number] Age: 48 Referring MD:  Date of Birth: 1976-01-08 Gender: Male Account #: [id number] Procedure:                Upper GI endoscopy Indications:              Abdominal pain in the right upper quadrant,                            Abnormal CT of the GI tract, Weight loss Medicines:                Monitored Anesthesia Care Procedure:                Pre-Anesthesia Assessment:                           - Prior to the procedure, a History and Physical                            was performed, and patient medications and                            allergies were reviewed. The patient's tolerance of                            previous anesthesia was also reviewed. The risks                            and benefits of the procedure and the sedation                            options and risks were discussed with the patient.                            All questions were answered, and informed consent                            was obtained. Prior Anticoagulants: The patient has                            taken no anticoagulant or antiplatelet agents. ASA                            Grade Assessment: II - A patient with mild systemic                            disease. After reviewing the risks and benefits,                            the patient was deemed in satisfactory condition to                            undergo the procedure.  After obtaining informed consent, the endoscope was                            passed under direct vision. Throughout the                            procedure, the patient's blood pressure, pulse, and                            oxygen saturations were monitored continuously. The                            Olympus Scope SN [id number] was introduced through the                            mouth, and  advanced to the second part of duodenum.                            The upper GI endoscopy was accomplished without                            difficulty. The patient tolerated the procedure                            well. Scope In: Scope Out: Findings:                 The esophagus was normal.                           The stomach was normal. Small hiatal hernia                           The examined duodenum was normal.                           The cardia and gastric fundus were normal on                            retroflexion. Complications:            No immediate complications. Estimated Blood Loss:     Estimated blood loss: none. Impression:               - Normal esophagus.                           - Normal stomach. Small hiatal hernia.                           - Normal examined duodenum.                           - No specimens collected. Recommendation:           - Patient has a contact number available for  emergencies. The signs and symptoms of potential                            delayed complications were discussed with the                            patient. Return to normal activities tomorrow.                            Written discharge instructions were provided to the                            patient.                           - Resume previous diet.                           - Continue present medications. Norleen SAILOR. Abran, MD 05/30/2023 10:24:03 AM This report has been signed electronically.

## 2023-05-30 NOTE — Progress Notes (Signed)
 Report to PACU, RN, vss, BBS= Clear.
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
## 2023-05-30 NOTE — Patient Instructions (Signed)

## 2023-05-31 ENCOUNTER — Telehealth: Payer: Self-pay

## 2023-05-31 NOTE — Telephone Encounter (Signed)
  Follow up Call-     05/30/2023    9:36 AM 03/10/2022    7:06 AM  Call back number  Post procedure Call Back phone  # [phone]  Permission to leave phone message Yes Yes     Patient questions:  Do you have a fever, pain , or abdominal swelling? No. Pain Score  0 *  Have you tolerated food without any problems? Yes.    Have you been able to return to your normal activities? Yes.    Do you have any questions about your discharge instructions: Diet   No. Medications  No. Follow up visit  No.  Do you have questions or concerns about your Care? No.  Actions: * If pain score is 4 or above: No action needed, pain <4.

## 2023-06-27 ENCOUNTER — Encounter: Payer: Self-pay | Admitting: Neurosurgery

## 2023-11-14 ENCOUNTER — Ambulatory Visit: Payer: Federal, State, Local not specified - PPO | Admitting: Neurosurgery

## 2023-11-23 ENCOUNTER — Other Ambulatory Visit: Payer: Self-pay

## 2023-11-23 DIAGNOSIS — M4802 Spinal stenosis, cervical region: Secondary | ICD-10-CM

## 2023-11-23 DIAGNOSIS — M5412 Radiculopathy, cervical region: Secondary | ICD-10-CM

## 2023-11-30 ENCOUNTER — Ambulatory Visit
Admission: RE | Admit: 2023-11-30 | Discharge: 2023-11-30 | Disposition: A | Discharge status: Home / Self Care | Attending: Neurosurgery | Admitting: Neurosurgery

## 2023-11-30 ENCOUNTER — Ambulatory Visit: Admitting: Neurosurgery

## 2023-11-30 ENCOUNTER — Encounter: Payer: Self-pay | Admitting: Neurosurgery

## 2023-11-30 ENCOUNTER — Ambulatory Visit
Admission: RE | Admit: 2023-11-30 | Discharge: 2023-11-30 | Disposition: A | Discharge status: Home / Self Care | Source: Ambulatory Visit | Attending: Neurosurgery | Admitting: Neurosurgery

## 2023-11-30 VITALS — BP 116/78 | Ht 69.0 in | Wt 171.0 lb

## 2023-11-30 DIAGNOSIS — Z9889 Other specified postprocedural states: Secondary | ICD-10-CM

## 2023-11-30 DIAGNOSIS — M5412 Radiculopathy, cervical region: Secondary | ICD-10-CM

## 2023-11-30 DIAGNOSIS — M4802 Spinal stenosis, cervical region: Secondary | ICD-10-CM

## 2023-11-30 NOTE — Progress Notes (Signed)
   REFERRING PHYSICIAN:  No referring provider defined for this encounter.  DOS: 02/13/23  Cervical disc arthroplasty C5-C6 and C6-C7  HISTORY OF PRESENT ILLNESS: Brandon Clayton is status post above surgery.  He is doing very well.  His arms feel much better.    PHYSICAL EXAMINATION:  NEUROLOGICAL:  General: In no acute distress.   Awake, alert, oriented to person, place, and time.  Pupils equal round and reactive to light.  Facial tone is symmetric.    Strength: Side Biceps Triceps Deltoid Interossei Grip Wrist Ext. Wrist Flex.  R 5 5 5 5 5 5 5   L 5 5 5 5 5 5 5    Incision c/d/i  Imaging:  No complications noted  Assessment / Plan: Brandon Clayton is doing well s/p above surgery.   He is doing extremely well.  He has occasional numbness.  Overall, I am very pleased with his improvements.  I will see him back on an as-needed basis.  I spent a total of 10 minutes in this patient's care today. This time was spent reviewing pertinent records including imaging studies, obtaining and confirming history, performing a directed evaluation, formulating and discussing my recommendations, and documenting the visit within the medical record.     Reeves Daisy MD Dept of Neurosurgery

## 2024-02-06 ENCOUNTER — Ambulatory Visit (INDEPENDENT_AMBULATORY_CARE_PROVIDER_SITE_OTHER): Admitting: Family Medicine

## 2024-02-06 ENCOUNTER — Encounter: Payer: Self-pay | Admitting: Family Medicine

## 2024-02-06 ENCOUNTER — Ambulatory Visit: Payer: Self-pay | Admitting: Family Medicine

## 2024-02-06 VITALS — BP 110/66 | HR 73 | Temp 98.0°F | Ht 69.0 in | Wt 170.0 lb

## 2024-02-06 DIAGNOSIS — Z125 Encounter for screening for malignant neoplasm of prostate: Secondary | ICD-10-CM

## 2024-02-06 DIAGNOSIS — E663 Overweight: Secondary | ICD-10-CM | POA: Diagnosis not present

## 2024-02-06 DIAGNOSIS — Z0000 Encounter for general adult medical examination without abnormal findings: Secondary | ICD-10-CM

## 2024-02-06 DIAGNOSIS — E782 Mixed hyperlipidemia: Secondary | ICD-10-CM | POA: Diagnosis not present

## 2024-02-06 DIAGNOSIS — Z131 Encounter for screening for diabetes mellitus: Secondary | ICD-10-CM

## 2024-02-06 LAB — CBC WITH DIFFERENTIAL/PLATELET
Basophils Absolute: 0.1 K/uL (ref 0.0–0.1)
Basophils Relative: 0.9 % (ref 0.0–3.0)
Eosinophils Absolute: 0.2 K/uL (ref 0.0–0.7)
Eosinophils Relative: 3.7 % (ref 0.0–5.0)
HCT: 43.5 % (ref 39.0–52.0)
Hemoglobin: 15 g/dL (ref 13.0–17.0)
Lymphocytes Relative: 34.9 % (ref 12.0–46.0)
Lymphs Abs: 2.3 K/uL (ref 0.7–4.0)
MCHC: 34.5 g/dL (ref 30.0–36.0)
MCV: 91.8 fl (ref 78.0–100.0)
Monocytes Absolute: 0.4 K/uL (ref 0.1–1.0)
Monocytes Relative: 6.7 % (ref 3.0–12.0)
Neutro Abs: 3.6 K/uL (ref 1.4–7.7)
Neutrophils Relative %: 53.8 % (ref 43.0–77.0)
Platelets: 344 K/uL (ref 150.0–400.0)
RBC: 4.73 Mil/uL (ref 4.22–5.81)
RDW: 12.9 % (ref 11.5–15.5)
WBC: 6.6 K/uL (ref 4.0–10.5)

## 2024-02-06 LAB — COMPREHENSIVE METABOLIC PANEL WITH GFR
ALT: 26 U/L (ref 0–53)
AST: 22 U/L (ref 0–37)
Albumin: 4.5 g/dL (ref 3.5–5.2)
Alkaline Phosphatase: 64 U/L (ref 39–117)
BUN: 16 mg/dL (ref 6–23)
CO2: 28 meq/L (ref 19–32)
Calcium: 9.6 mg/dL (ref 8.4–10.5)
Chloride: 105 meq/L (ref 96–112)
Creatinine, Ser: 1.02 mg/dL (ref 0.40–1.50)
GFR: 87.87 mL/min (ref >60.00)
Glucose, Bld: 82 mg/dL (ref 70–99)
Potassium: 4.4 meq/L (ref 3.5–5.1)
Sodium: 139 meq/L (ref 135–145)
Total Bilirubin: 0.9 mg/dL (ref 0.2–1.2)
Total Protein: 6.9 g/dL (ref 6.0–8.3)

## 2024-02-06 LAB — LIPID PANEL
Cholesterol: 165 mg/dL (ref 0–200)
HDL: 62.6 mg/dL (ref >39.00)
LDL Cholesterol: 66 mg/dL (ref 0–99)
NonHDL: 102.17
Total CHOL/HDL Ratio: 3
Triglycerides: 183 mg/dL — ABNORMAL HIGH (ref 0.0–149.0)
VLDL: 36.6 mg/dL (ref 0.0–40.0)

## 2024-02-06 LAB — HEMOGLOBIN A1C: Hgb A1c MFr Bld: 5.1 % (ref 4.6–6.5)

## 2024-02-06 LAB — PSA: PSA: 1.43 ng/mL (ref 0.10–4.00)

## 2024-02-06 NOTE — Patient Instructions (Addendum)
 Please stop by lab before you go If you have mychart- we will send your results within 3 business days of us  receiving them.  If you do not have mychart- we will call you about results within 5 business days of us  receiving them.  *please also note that you will see labs on mychart as soon as they post. I will later go in and write notes on them- will say notes from Dr. Katrinka   Recommended follow up: Return in about 1 year (around 02/05/2025) for physical or sooner if needed.Schedule b4 you leave.
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
## 2024-03-14 ENCOUNTER — Encounter: Payer: Self-pay | Admitting: Orthopedic Surgery

## 2024-03-14 ENCOUNTER — Telehealth: Payer: Self-pay

## 2024-03-14 NOTE — Telephone Encounter (Signed)
 This would be appropriate to start with a PA.  Sent powershare request to [employer]

## 2024-03-14 NOTE — Telephone Encounter (Signed)
-----   Message from Dubois B sent at 03/13/2024  2:06 PM EDT ----- Regarding: appointment request Patient is currently in the ER at [facility] for a lumbar injury sustained at work and would like to know if you would see him for this issue. This will be under worker's comp and he is aware we will need the worker's comp information. Please advise.

## 2024-03-14 NOTE — Telephone Encounter (Signed)
 Patient scheduled with Stacy on 11/05.
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
## 2024-03-18 ENCOUNTER — Inpatient Hospital Stay
Admission: RE | Admit: 2024-03-18 | Discharge: 2024-03-18 | Disposition: A | Discharge status: Home / Self Care | Payer: Self-pay | Source: Ambulatory Visit | Attending: Orthopedic Surgery | Admitting: Orthopedic Surgery

## 2024-03-18 ENCOUNTER — Other Ambulatory Visit: Payer: Self-pay

## 2024-03-18 DIAGNOSIS — Z049 Encounter for examination and observation for unspecified reason: Secondary | ICD-10-CM

## 2024-03-20 ENCOUNTER — Ambulatory Visit (INDEPENDENT_AMBULATORY_CARE_PROVIDER_SITE_OTHER): Admitting: Orthopedic Surgery

## 2024-03-20 ENCOUNTER — Encounter: Payer: Self-pay | Admitting: Orthopedic Surgery

## 2024-03-20 VITALS — BP 120/84 | Wt 177.6 lb

## 2024-03-20 DIAGNOSIS — M47816 Spondylosis without myelopathy or radiculopathy, lumbar region: Secondary | ICD-10-CM

## 2024-03-20 DIAGNOSIS — M5126 Other intervertebral disc displacement, lumbar region: Secondary | ICD-10-CM

## 2024-03-20 NOTE — Patient Instructions (Signed)
 It was so nice to see you today. Thank you so much for coming in.    Your CT scan showed disc bulging at L2-L3 and L3-L4.   I want to get an MRI of your lumbar spine to look into things further. We will get this approved through your insurance and [facility] will call you to schedule the appointment. Ask about your patient responsibility. You do not need to pay this prior to getting MRI, they can bill you.   [facility] is located at [address] in [place]. This is near the intersection of [address] and [address].   After you have the MRI, it can take 14-28 days for me to get the results back. If I don't have them in 2 weeks, we will call to try to get the results.   Once I have the results, we will call you to schedule a follow up visit with me to review them.   I sent physical therapy orders to [facility] in [place]. You can call them at [phone] if you don't hear from them to schedule your visit.   All of the above needs to be approved by WC.   I estimate you will be out of work for at least 4 weeks, but if you are feeling better then you can return earlier.   Please do not hesitate to call if you have any questions or concerns. You can also message me in MyChart.   Glade Boys PA-C [phone]     The physicians and staff at [facility] at [facility] are committed to providing excellent care. You may receive a survey asking for feedback about your experience at our office. We value you your feedback and appreciate you taking the time to to fill it out. The [facility] leadership team is also available to discuss your experience in person, feel free to contact us  [phone].

## 2024-03-22 NOTE — Telephone Encounter (Signed)
Information updated in chart

## 2024-03-25 ENCOUNTER — Ambulatory Visit
Admission: RE | Admit: 2024-03-25 | Discharge: 2024-03-25 | Disposition: A | Discharge status: Home / Self Care | Payer: Self-pay | Source: Ambulatory Visit | Attending: Orthopedic Surgery | Admitting: Orthopedic Surgery

## 2024-03-25 DIAGNOSIS — M47816 Spondylosis without myelopathy or radiculopathy, lumbar region: Secondary | ICD-10-CM
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
## 2024-04-16 ENCOUNTER — Ambulatory Visit: Admitting: Neurosurgery

## 2025-02-10 ENCOUNTER — Encounter: Admitting: Family Medicine
